# Patient Record
Sex: Female | Born: 1960 | Race: Black or African American | Hispanic: No | Marital: Married | Smoking: Current every day smoker
Health system: Southern US, Community
[De-identification: ages and names within clinical notes are randomized; demographics above are authoritative.]

## PROBLEM LIST (undated history)

## (undated) DIAGNOSIS — I10 Essential (primary) hypertension: Secondary | ICD-10-CM

## (undated) DIAGNOSIS — T7840XA Allergy, unspecified, initial encounter: Secondary | ICD-10-CM

## (undated) DIAGNOSIS — K279 Peptic ulcer, site unspecified, unspecified as acute or chronic, without hemorrhage or perforation: Secondary | ICD-10-CM

## (undated) DIAGNOSIS — D509 Iron deficiency anemia, unspecified: Secondary | ICD-10-CM

## (undated) DIAGNOSIS — Z720 Tobacco use: Secondary | ICD-10-CM

## (undated) HISTORY — DX: Essential (primary) hypertension: I10

## (undated) HISTORY — DX: Allergy, unspecified, initial encounter: T78.40XA

## (undated) HISTORY — DX: Peptic ulcer, site unspecified, unspecified as acute or chronic, without hemorrhage or perforation: K27.9

## (undated) HISTORY — DX: Tobacco use: Z72.0

## (undated) HISTORY — DX: Iron deficiency anemia, unspecified: D50.9

---

## 1999-05-27 ENCOUNTER — Emergency Department (HOSPITAL_COMMUNITY): Admission: EM | Admit: 1999-05-27 | Discharge: 1999-05-27 | Payer: Self-pay | Admitting: Emergency Medicine

## 1999-05-27 ENCOUNTER — Encounter: Payer: Self-pay | Admitting: Emergency Medicine

## 2004-03-01 ENCOUNTER — Ambulatory Visit: Payer: Self-pay | Admitting: Family Medicine

## 2004-03-01 DIAGNOSIS — N959 Unspecified menopausal and perimenopausal disorder: Secondary | ICD-10-CM | POA: Insufficient documentation

## 2004-12-06 ENCOUNTER — Ambulatory Visit: Payer: Self-pay | Admitting: Family Medicine

## 2005-05-31 ENCOUNTER — Encounter (INDEPENDENT_AMBULATORY_CARE_PROVIDER_SITE_OTHER): Payer: Self-pay | Admitting: Family Medicine

## 2005-05-31 LAB — CONVERTED CEMR LAB: Microalbumin U total vol: 1.05 mg/L

## 2005-06-26 ENCOUNTER — Encounter (INDEPENDENT_AMBULATORY_CARE_PROVIDER_SITE_OTHER): Payer: Self-pay | Admitting: *Deleted

## 2005-06-26 ENCOUNTER — Ambulatory Visit: Payer: Self-pay | Admitting: Family Medicine

## 2005-07-13 ENCOUNTER — Emergency Department (HOSPITAL_COMMUNITY): Admission: EM | Admit: 2005-07-13 | Discharge: 2005-07-13 | Payer: Self-pay | Admitting: Emergency Medicine

## 2005-07-18 ENCOUNTER — Ambulatory Visit: Payer: Self-pay | Admitting: Family Medicine

## 2006-06-22 ENCOUNTER — Ambulatory Visit: Payer: Self-pay | Admitting: Family Medicine

## 2006-06-23 ENCOUNTER — Encounter (INDEPENDENT_AMBULATORY_CARE_PROVIDER_SITE_OTHER): Payer: Self-pay | Admitting: Family Medicine

## 2006-06-23 LAB — CONVERTED CEMR LAB: TSH: 0.567 microintl units/mL

## 2006-07-27 ENCOUNTER — Ambulatory Visit: Payer: Self-pay | Admitting: Family Medicine

## 2006-07-27 ENCOUNTER — Encounter (INDEPENDENT_AMBULATORY_CARE_PROVIDER_SITE_OTHER): Payer: Self-pay | Admitting: Specialist

## 2006-07-29 ENCOUNTER — Encounter (INDEPENDENT_AMBULATORY_CARE_PROVIDER_SITE_OTHER): Payer: Self-pay | Admitting: Family Medicine

## 2006-07-29 LAB — CONVERTED CEMR LAB: Pap Smear: NORMAL

## 2006-11-10 ENCOUNTER — Ambulatory Visit: Payer: Self-pay | Admitting: Family Medicine

## 2006-11-19 ENCOUNTER — Encounter (INDEPENDENT_AMBULATORY_CARE_PROVIDER_SITE_OTHER): Payer: Self-pay | Admitting: Family Medicine

## 2006-11-19 DIAGNOSIS — K279 Peptic ulcer, site unspecified, unspecified as acute or chronic, without hemorrhage or perforation: Secondary | ICD-10-CM

## 2006-11-19 DIAGNOSIS — A599 Trichomoniasis, unspecified: Secondary | ICD-10-CM

## 2006-11-19 DIAGNOSIS — I10 Essential (primary) hypertension: Secondary | ICD-10-CM

## 2006-11-19 DIAGNOSIS — R011 Cardiac murmur, unspecified: Secondary | ICD-10-CM | POA: Insufficient documentation

## 2006-11-19 HISTORY — DX: Peptic ulcer, site unspecified, unspecified as acute or chronic, without hemorrhage or perforation: K27.9

## 2006-11-30 DIAGNOSIS — J309 Allergic rhinitis, unspecified: Secondary | ICD-10-CM | POA: Insufficient documentation

## 2006-12-28 ENCOUNTER — Telehealth (INDEPENDENT_AMBULATORY_CARE_PROVIDER_SITE_OTHER): Payer: Self-pay | Admitting: *Deleted

## 2006-12-28 ENCOUNTER — Emergency Department (HOSPITAL_COMMUNITY): Admission: EM | Admit: 2006-12-28 | Discharge: 2006-12-28 | Payer: Self-pay | Admitting: Family Medicine

## 2007-08-09 ENCOUNTER — Ambulatory Visit: Payer: Self-pay | Admitting: Family Medicine

## 2007-08-09 ENCOUNTER — Encounter (INDEPENDENT_AMBULATORY_CARE_PROVIDER_SITE_OTHER): Payer: Self-pay | Admitting: Family Medicine

## 2007-08-11 LAB — CONVERTED CEMR LAB
Albumin: 4 g/dL (ref 3.5–5.2)
Alkaline Phosphatase: 72 units/L (ref 39–117)
Chloride: 101 meq/L (ref 96–112)
Eosinophils Absolute: 0.2 10*3/uL (ref 0.0–0.7)
Glucose, Bld: 78 mg/dL (ref 70–99)
LDL Cholesterol: 89 mg/dL (ref 0–99)
Lymphocytes Relative: 30 % (ref 12–46)
Lymphs Abs: 2.3 10*3/uL (ref 0.7–4.0)
MCV: 88.7 fL (ref 78.0–100.0)
Neutro Abs: 4.8 10*3/uL (ref 1.7–7.7)
Neutrophils Relative %: 61 % (ref 43–77)
Platelets: 415 10*3/uL — ABNORMAL HIGH (ref 150–400)
Potassium: 3.3 meq/L — ABNORMAL LOW (ref 3.5–5.3)
Sodium: 140 meq/L (ref 135–145)
TSH: 1.026 microintl units/mL (ref 0.350–5.50)
Total Protein: 6.9 g/dL (ref 6.0–8.3)
Triglycerides: 100 mg/dL (ref <150)
WBC: 7.8 10*3/uL (ref 4.0–10.5)

## 2007-08-17 ENCOUNTER — Encounter (INDEPENDENT_AMBULATORY_CARE_PROVIDER_SITE_OTHER): Payer: Self-pay | Admitting: Family Medicine

## 2007-12-31 ENCOUNTER — Ambulatory Visit: Payer: Self-pay | Admitting: Internal Medicine

## 2007-12-31 DIAGNOSIS — H1030 Unspecified acute conjunctivitis, unspecified eye: Secondary | ICD-10-CM | POA: Insufficient documentation

## 2008-02-02 ENCOUNTER — Telehealth (INDEPENDENT_AMBULATORY_CARE_PROVIDER_SITE_OTHER): Payer: Self-pay | Admitting: *Deleted

## 2008-02-15 ENCOUNTER — Encounter (INDEPENDENT_AMBULATORY_CARE_PROVIDER_SITE_OTHER): Payer: Self-pay | Admitting: *Deleted

## 2008-05-29 ENCOUNTER — Ambulatory Visit: Payer: Self-pay | Admitting: Family Medicine

## 2008-05-29 DIAGNOSIS — N950 Postmenopausal bleeding: Secondary | ICD-10-CM

## 2008-05-29 LAB — CONVERTED CEMR LAB: LH: 47.8 milliintl units/mL

## 2008-06-02 ENCOUNTER — Ambulatory Visit (HOSPITAL_COMMUNITY): Admission: RE | Admit: 2008-06-02 | Discharge: 2008-06-02 | Payer: Self-pay | Admitting: Family Medicine

## 2008-06-05 ENCOUNTER — Telehealth (INDEPENDENT_AMBULATORY_CARE_PROVIDER_SITE_OTHER): Payer: Self-pay | Admitting: Family Medicine

## 2008-07-28 ENCOUNTER — Ambulatory Visit: Payer: Self-pay | Admitting: Nurse Practitioner

## 2008-07-28 DIAGNOSIS — R634 Abnormal weight loss: Secondary | ICD-10-CM | POA: Insufficient documentation

## 2008-07-28 DIAGNOSIS — F17200 Nicotine dependence, unspecified, uncomplicated: Secondary | ICD-10-CM | POA: Insufficient documentation

## 2008-08-08 ENCOUNTER — Ambulatory Visit: Payer: Self-pay | Admitting: Family Medicine

## 2008-08-08 ENCOUNTER — Encounter (INDEPENDENT_AMBULATORY_CARE_PROVIDER_SITE_OTHER): Payer: Self-pay | Admitting: Family Medicine

## 2008-08-08 DIAGNOSIS — B373 Candidiasis of vulva and vagina: Secondary | ICD-10-CM

## 2008-08-08 LAB — CONVERTED CEMR LAB
Glucose, Urine, Semiquant: NEGATIVE
pH: 7

## 2008-08-09 ENCOUNTER — Ambulatory Visit: Payer: Self-pay | Admitting: Family Medicine

## 2008-08-09 LAB — CONVERTED CEMR LAB
ALT: 13 units/L (ref 0–35)
AST: 20 units/L (ref 0–37)
Basophils Relative: 0 % (ref 0–1)
CO2: 36 meq/L — ABNORMAL HIGH (ref 19–32)
Cholesterol: 130 mg/dL (ref 0–200)
Creatinine, Ser: 0.84 mg/dL (ref 0.40–1.20)
Eosinophils Absolute: 0.1 10*3/uL (ref 0.0–0.7)
MCHC: 31.8 g/dL (ref 30.0–36.0)
MCV: 88.2 fL (ref 78.0–100.0)
Neutrophils Relative %: 67 % (ref 43–77)
Platelets: 375 10*3/uL (ref 150–400)
Total Bilirubin: 0.3 mg/dL (ref 0.3–1.2)
Total CHOL/HDL Ratio: 3.3
VLDL: 19 mg/dL (ref 0–40)

## 2008-08-10 ENCOUNTER — Telehealth (INDEPENDENT_AMBULATORY_CARE_PROVIDER_SITE_OTHER): Payer: Self-pay | Admitting: Family Medicine

## 2008-08-11 ENCOUNTER — Telehealth (INDEPENDENT_AMBULATORY_CARE_PROVIDER_SITE_OTHER): Payer: Self-pay | Admitting: Family Medicine

## 2008-08-14 ENCOUNTER — Ambulatory Visit: Payer: Self-pay | Admitting: *Deleted

## 2008-08-24 ENCOUNTER — Ambulatory Visit: Payer: Self-pay | Admitting: Family Medicine

## 2008-08-28 ENCOUNTER — Encounter (INDEPENDENT_AMBULATORY_CARE_PROVIDER_SITE_OTHER): Payer: Self-pay | Admitting: Family Medicine

## 2009-01-10 ENCOUNTER — Ambulatory Visit: Payer: Self-pay | Admitting: Physician Assistant

## 2009-01-10 DIAGNOSIS — E876 Hypokalemia: Secondary | ICD-10-CM

## 2009-01-10 LAB — CONVERTED CEMR LAB
CO2: 24 meq/L (ref 19–32)
Calcium: 8.4 mg/dL (ref 8.4–10.5)
Chloride: 107 meq/L (ref 96–112)
Glucose, Bld: 89 mg/dL (ref 70–99)
Sodium: 140 meq/L (ref 135–145)

## 2009-01-11 ENCOUNTER — Encounter: Payer: Self-pay | Admitting: Physician Assistant

## 2009-01-17 ENCOUNTER — Ambulatory Visit: Payer: Self-pay | Admitting: Physician Assistant

## 2009-01-17 DIAGNOSIS — R002 Palpitations: Secondary | ICD-10-CM | POA: Insufficient documentation

## 2009-01-18 ENCOUNTER — Encounter: Payer: Self-pay | Admitting: Physician Assistant

## 2009-01-18 ENCOUNTER — Ambulatory Visit (HOSPITAL_COMMUNITY): Admission: RE | Admit: 2009-01-18 | Discharge: 2009-01-18 | Payer: Self-pay | Admitting: Internal Medicine

## 2009-01-18 ENCOUNTER — Encounter (INDEPENDENT_AMBULATORY_CARE_PROVIDER_SITE_OTHER): Payer: Self-pay | Admitting: Internal Medicine

## 2009-01-22 ENCOUNTER — Encounter: Payer: Self-pay | Admitting: Physician Assistant

## 2009-01-23 ENCOUNTER — Encounter: Payer: Self-pay | Admitting: Physician Assistant

## 2009-01-25 ENCOUNTER — Encounter (INDEPENDENT_AMBULATORY_CARE_PROVIDER_SITE_OTHER): Payer: Self-pay | Admitting: *Deleted

## 2009-02-01 ENCOUNTER — Encounter: Payer: Self-pay | Admitting: Physician Assistant

## 2009-02-07 ENCOUNTER — Telehealth (INDEPENDENT_AMBULATORY_CARE_PROVIDER_SITE_OTHER): Payer: Self-pay | Admitting: Nurse Practitioner

## 2009-02-12 LAB — CONVERTED CEMR LAB
Ferritin: 11 ng/mL (ref 10–291)
Folate: 10.4 ng/mL

## 2009-02-13 ENCOUNTER — Telehealth: Payer: Self-pay | Admitting: Physician Assistant

## 2009-02-19 ENCOUNTER — Telehealth: Payer: Self-pay | Admitting: Physician Assistant

## 2009-02-27 DIAGNOSIS — D509 Iron deficiency anemia, unspecified: Secondary | ICD-10-CM

## 2009-02-27 HISTORY — DX: Iron deficiency anemia, unspecified: D50.9

## 2009-03-01 ENCOUNTER — Encounter (INDEPENDENT_AMBULATORY_CARE_PROVIDER_SITE_OTHER): Payer: Self-pay | Admitting: *Deleted

## 2009-03-28 ENCOUNTER — Emergency Department (HOSPITAL_COMMUNITY): Admission: EM | Admit: 2009-03-28 | Discharge: 2009-03-28 | Payer: Self-pay | Admitting: Family Medicine

## 2009-03-29 ENCOUNTER — Ambulatory Visit: Payer: Self-pay | Admitting: Physician Assistant

## 2009-03-29 LAB — CONVERTED CEMR LAB
BUN: 13 mg/dL (ref 6–23)
Basophils Absolute: 0 10*3/uL (ref 0.0–0.1)
Chloride: 107 meq/L (ref 96–112)
Eosinophils Relative: 2 % (ref 0–5)
HCT: 34.9 % — ABNORMAL LOW (ref 36.0–46.0)
Hemoglobin: 11 g/dL — ABNORMAL LOW (ref 12.0–15.0)
Lymphocytes Relative: 31 % (ref 12–46)
Lymphs Abs: 1.6 10*3/uL (ref 0.7–4.0)
Monocytes Absolute: 0.5 10*3/uL (ref 0.1–1.0)
Neutro Abs: 3 10*3/uL (ref 1.7–7.7)
Potassium: 3.4 meq/L — ABNORMAL LOW (ref 3.5–5.3)
Sodium: 143 meq/L (ref 135–145)
WBC: 5.2 10*3/uL (ref 4.0–10.5)

## 2009-04-03 ENCOUNTER — Ambulatory Visit: Payer: Self-pay | Admitting: Physician Assistant

## 2009-04-05 LAB — CONVERTED CEMR LAB
Hgb A2 Quant: 2.5 % (ref 2.2–3.2)
Hgb A: 97.5 % (ref 96.8–97.8)
Hgb S Quant: 0 % (ref 0.0–0.0)

## 2009-04-19 ENCOUNTER — Telehealth: Payer: Self-pay | Admitting: Physician Assistant

## 2009-04-27 ENCOUNTER — Ambulatory Visit: Payer: Self-pay | Admitting: Physician Assistant

## 2009-04-27 DIAGNOSIS — N760 Acute vaginitis: Secondary | ICD-10-CM | POA: Insufficient documentation

## 2009-04-27 DIAGNOSIS — S335XXA Sprain of ligaments of lumbar spine, initial encounter: Secondary | ICD-10-CM

## 2009-04-27 DIAGNOSIS — R82998 Other abnormal findings in urine: Secondary | ICD-10-CM

## 2009-04-27 LAB — CONVERTED CEMR LAB
Bilirubin Urine: NEGATIVE
Glucose, Urine, Semiquant: NEGATIVE
KOH Prep: NEGATIVE
Protein, U semiquant: 100
Specific Gravity, Urine: 1.015
Whiff Test: POSITIVE
pH: 7

## 2009-04-28 ENCOUNTER — Encounter: Payer: Self-pay | Admitting: Physician Assistant

## 2009-06-15 ENCOUNTER — Telehealth: Payer: Self-pay | Admitting: Physician Assistant

## 2009-06-22 ENCOUNTER — Encounter: Payer: Self-pay | Admitting: Physician Assistant

## 2010-01-16 ENCOUNTER — Ambulatory Visit: Payer: Self-pay | Admitting: Nurse Practitioner

## 2010-01-16 ENCOUNTER — Encounter (INDEPENDENT_AMBULATORY_CARE_PROVIDER_SITE_OTHER): Payer: Self-pay | Admitting: Internal Medicine

## 2010-01-16 LAB — CONVERTED CEMR LAB
Calcium: 9.1 mg/dL (ref 8.4–10.5)
Creatinine, Ser: 0.75 mg/dL (ref 0.40–1.20)
Sodium: 140 meq/L (ref 135–145)

## 2010-01-21 ENCOUNTER — Encounter (INDEPENDENT_AMBULATORY_CARE_PROVIDER_SITE_OTHER): Payer: Self-pay | Admitting: Internal Medicine

## 2010-02-13 ENCOUNTER — Encounter: Payer: Self-pay | Admitting: Physician Assistant

## 2010-02-13 ENCOUNTER — Ambulatory Visit: Payer: Self-pay | Admitting: Nurse Practitioner

## 2010-02-13 DIAGNOSIS — K5900 Constipation, unspecified: Secondary | ICD-10-CM | POA: Insufficient documentation

## 2010-02-19 ENCOUNTER — Telehealth (INDEPENDENT_AMBULATORY_CARE_PROVIDER_SITE_OTHER): Payer: Self-pay | Admitting: Nurse Practitioner

## 2010-04-20 ENCOUNTER — Encounter: Payer: Self-pay | Admitting: Internal Medicine

## 2010-04-21 ENCOUNTER — Encounter: Payer: Self-pay | Admitting: Family Medicine

## 2010-04-21 ENCOUNTER — Encounter: Payer: Self-pay | Admitting: Occupational Therapy

## 2010-04-21 ENCOUNTER — Encounter: Payer: Self-pay | Admitting: Internal Medicine

## 2010-04-22 ENCOUNTER — Encounter: Payer: Self-pay | Admitting: Family Medicine

## 2010-04-28 LAB — CONVERTED CEMR LAB
BUN: 13 mg/dL (ref 6–23)
Basophils Absolute: 0 10*3/uL (ref 0.0–0.1)
Basophils Absolute: 0 10*3/uL (ref 0.0–0.1)
Basophils Relative: 0 % (ref 0–1)
Basophils Relative: 0 % (ref 0–1)
Blood in Urine, dipstick: NEGATIVE
Calcium: 8.4 mg/dL (ref 8.4–10.5)
Chlamydia, DNA Probe: NEGATIVE
Creatinine, Ser: 0.82 mg/dL (ref 0.40–1.20)
Eosinophils Absolute: 0.1 10*3/uL (ref 0.0–0.7)
Eosinophils Relative: 2 % (ref 0–5)
GC Probe Amp, Genital: NEGATIVE
Glucose, Bld: 82 mg/dL (ref 70–99)
Glucose, Urine, Semiquant: NEGATIVE
HCT: 33.7 % — ABNORMAL LOW (ref 36.0–46.0)
Hemoglobin: 10.6 g/dL — ABNORMAL LOW (ref 12.0–15.0)
KOH Prep: NEGATIVE
Lymphocytes Relative: 28 % (ref 12–46)
Lymphocytes Relative: 32 % (ref 12–46)
MCHC: 30.9 g/dL (ref 30.0–36.0)
MCHC: 32.2 g/dL (ref 30.0–36.0)
MCV: 90.1 fL (ref 78.0–100.0)
Magnesium: 2.1 mg/dL (ref 1.5–2.5)
Monocytes Absolute: 0.3 10*3/uL (ref 0.1–1.0)
Neutro Abs: 3.7 10*3/uL (ref 1.7–7.7)
Nitrite: NEGATIVE
OCCULT 1: NEGATIVE
Platelets: 304 10*3/uL (ref 150–400)
Platelets: 345 10*3/uL (ref 150–400)
RDW: 15.6 % — ABNORMAL HIGH (ref 11.5–15.5)
RDW: 16.3 % — ABNORMAL HIGH (ref 11.5–15.5)
Rapid HIV Screen: NEGATIVE
TSH: 1.35 microintl units/mL (ref 0.350–4.500)
TSH: 1.408 microintl units/mL (ref 0.350–4.500)
pH: 6.5

## 2010-05-02 NOTE — Assessment & Plan Note (Signed)
Summary: FOLLOW UP WITH Oyindamola Key PA //GK   Vital Signs:  Patient profile:   50 year old female Menstrual status:  irregular Height:      63.5 inches Weight:      145 pounds BMI:     25.37 Temp:     97.4 degrees F oral Pulse rate:   62 / minute Pulse rhythm:   regular Resp:     18 per minute BP sitting:   139 / 88  (right arm) Cuff size:   regular  Vitals Entered By: Armenia Shannon (April 03, 2009 11:53 AM) CC: pt would like a refill on potassium med since she has not got the maxzide yet...., Hypertension Management Is Patient Diabetic? No Pain Assessment Patient in pain? no       Does patient need assistance? Functional Status Self care Ambulation Normal   CC:  pt would like a refill on potassium med since she has not got the maxzide yet.... and Hypertension Management.  History of Present Illness: Here for f/u. Her palp's have resolved.  I tried to switch her to to [facility] but she had so much HCTZ left that she stayed on it.  She ran out of potassium.  She has not had any more palp's.  She did have labs done last week.  The potassium was low. Her hgb has come up to 11.  She is taking the iron.  Recent labs confirm that she has iron def. anemia.  She does not have a period any longer.  She states she has a h/o anemia and has been iron for a long time.  She says she has a h/o colitis.  Do not see this in the EMR.  She describes a barium enema many years ago and what sounds like diverticulosis.  She has occ. diarrhea if she eats the wrong thing.  Otherwise, she has no problems.  She denies melena, hematochezia, hematemesis, hematuria, hemoptysis.  She denies any symptoms of GERD.  No weight loss.  No fevers.  She still has hot flashes and mood swings from post-menopausal syndrome.  She has not had a colonsocopy.  She never did the stool cards.   Hypertension History:      She denies chest pain, palpitations, dyspnea with exertion, and syncope.        Positive major  cardiovascular risk factors include hypertension and current tobacco user.  Negative major cardiovascular risk factors include female age less than 29 years old.    Current Medications (verified): 1)  Ocuflox 0.3 % Soln (Ofloxacin) .Marland Kitchen.. 1 Drop Left Eye 4 Times Daily For 7 Days 2)  Monistat 7  Kit (Miconazole Nitrate) .... Use Per Box Directions 3)  Diflucan 150 Mg Tabs (Fluconazole) .... Take 1 Tablet By Mouth Once A Day X 1 4)  Ferrous Sulfate 325 (65 Fe) Mg Tabs (Ferrous Sulfate) .... One Tablet By Mouth Two Times A Day 5)  Maxzide-25 37.5-25 Mg Tabs (Triamterene-Hctz) .... Take 1 Tablet By Mouth Once A Day  Allergies (verified): No Known Drug Allergies  Past History:  Past Medical History: Current Problems:  PALPITATIONS (ICD-785.1) HYPOKALEMIA (ICD-276.8) TOBACCO ABUSE (ICD-305.1) WEIGHT LOSS (ICD-783.21) POSTMENOPAUSAL SYNDROME (ICD-627.9) POSTMENOPAUSAL BLEEDING (ICD-627.1) ALLERGIC RHINITIS (ICD-477.9) h/o TRICHOMONIASIS NOS (ICD-131.9) SYSTOLIC MURMUR (UEA-540.9)   a.  Echo 12/2008:  Normal; Good LVF; No valvular abnormalities PEPTIC ULCER DISEASE (ICD-533.90) HYPERTENSION (ICD-401.9) Anemia-iron deficiency    a.  originally placed on Iron in 2005    b.  has reported 1 menstrual  cycle per year since about 2007 according to paper chart  Review of Systems  The patient denies fever and unusual weight change.    Physical Exam  General:  alert, well-developed, and well-nourished.   Head:  normocephalic and atraumatic.   Neck:  supple.   Lungs:  normal breath sounds, no crackles, and no wheezes.   Heart:  normal rate and regular rhythm.   Extremities:  no edema  Neurologic:  alert & oriented X3 and cranial nerves II-XII intact.   Psych:  normally interactive.     Impression & Recommendations:  Problem # 1:  ANEMIA-IRON DEFICIENCY (ICD-280.9)  ? etiology continue iron she has apparently had a long h/o IDA check stool cards check Hgb electrophoresis consider  colo +/- EGD with Dr. Corinda Gubler in the spring unless need to do sooner     Her updated medication list for this problem includes:    Ferrous Sulfate 325 (65 Fe) Mg Tabs (Ferrous sulfate) ..... One tablet by mouth two times a day  Orders: Hemoccult Cards -3 specimans (take home) (16109)  Problem # 2:  PALPITATIONS (ICD-785.1) resolved  Problem # 3:  HYPOKALEMIA (ICD-276.8) refill K+ as long as she continues to use HCTZ repeat BMET in 2 weeks  Problem # 4:  HYPERTENSION (ICD-401.9) fair control  The following medications were removed from the medication list:    Maxzide-25 37.5-25 Mg Tabs (Triamterene-hctz) .Marland Kitchen... Take 1 tablet by mouth once a day Her updated medication list for this problem includes:    Hydrochlorothiazide 25 Mg Tabs (Hydrochlorothiazide) .Marland Kitchen... Take 1 tablet by mouth once a day  Problem # 5:  SCREENING FOR MLIG NEOP, BREAST, NOS (ICD-V76.10)  past due for mammo  Orders: Mammogram (Screening) (Mammo)  Complete Medication List: 1)  Ocuflox 0.3 % Soln (Ofloxacin) .Marland Kitchen.. 1 drop left eye 4 times daily for 7 days 2)  Monistat 7 Kit (Miconazole nitrate) .... Use per box directions 3)  Diflucan 150 Mg Tabs (Fluconazole) .... Take 1 tablet by mouth once a day x 1 4)  Ferrous Sulfate 325 (65 Fe) Mg Tabs (Ferrous sulfate) .... One tablet by mouth two times a day 5)  Hydrochlorothiazide 25 Mg Tabs (Hydrochlorothiazide) .... Take 1 tablet by mouth once a day 6)  Klor-con M20 20 Meq Cr-tabs (Potassium chloride crys cr) .... Take 1 tablet by mouth once a day  Hypertension Assessment/Plan:      The patient's hypertensive risk group is category B: At least one risk factor (excluding diabetes) with no target organ damage.  Her calculated 10 year risk of coronary heart disease is 6 %.  Today's blood pressure is 139/88.  Her blood pressure goal is < 140/90.   Patient Instructions: 1)  Complete stool cards and return to our office. 2)  Stop potassium when you change HCTZ to  maxzide. 3)  Return to the office in 2 weeks for labs (BMET; Dx 401.1) 4)  Please schedule a follow-up appointment in 4 months with Arletha Marschke for CPP. 5)    Prescriptions: KLOR-CON M20 20 MEQ CR-TABS (POTASSIUM CHLORIDE CRYS CR) Take 1 tablet by mouth once a day  #30 x 2   Entered and Authorized by:   Tereso Newcomer PA-C   Signed by:   Tereso Newcomer PA-C on 04/03/2009   Method used:   Print then Give to Patient   RxID:   [id number]

## 2010-05-02 NOTE — Letter (Signed)
Summary: *HSN Results Follow up  [facility]  [address] [place], [place] [address]   Phone: [phone]  Fax: [phone]      01/21/2010   [name] [facility] [address] [place], [place]  [address]   Dear  Ms. Nicole Barrera,                            ____S.Drinkard,FNP   ____D. Gore,FNP       ____B. McPherson,MD   ____V. Rankins,MD    __X__E. Mulberry,MD    ____N. Daphine Deutscher, FNP  ____D. Reche Dixon, MD    ____K. Philipp Deputy, MD    ____Other     This letter is to inform you that your recent test(s):  _______Pap Smear    ____X___Lab Test     _______X-ray    ___X____ is within acceptable limits  _______ requires a medication change  _______ requires a follow-up lab visit  _______ requires a follow-up visit with your provider   Comments:  potassium and kidney function were fine.       _________________________________________________________ If you have any questions, please contact our office                     Sincerely,  Julieanne Manson MD [facility]

## 2010-05-02 NOTE — Progress Notes (Signed)
  Phone Note Call from Patient Call back at [facility] Phone [phone] Call back at [phone]   Summary of Call: Pt misplaced her potassium  medication (Klor con m20) and she would like to know if the provider can call in to [facility]. Alben Spittle PA  Initial call taken by: Manon Hilding,  April 19, 2009 9:41 AM  Follow-up for Phone Call        called med into pharmacy...Marland KitchenMarland KitchenArmenia Shannon  April 20, 2009 3:51 PM

## 2010-05-02 NOTE — Assessment & Plan Note (Signed)
Summary: Complete Physical Exam   Vital Signs:  Patient profile:   50 year old female Menstrual status:  postmenopausal Height:      63.5 inches Weight:      145.06 pounds BMI:     25.38 Temp:     97.2 degrees F oral BP sitting:   140 / 80  (left arm) Cuff size:   regular  Vitals Entered By: Hale Drone CMA (February 13, 2010 2:18 PM)  Nutrition Counseling: Patient's BMI is greater than 25 and therefore counseled on weight management options. CC: Here for her 50 y/o CP Is Patient Diabetic? No Pain Assessment Patient in pain? no       Does patient need assistance? Functional Status Self care Ambulation Normal LMP - Character: heavy     Menstrual Status postmenopausal Last PAP Result NEGATIVE FOR INTRAEPITHELIAL LESIONS OR MALIGNANCY.   CC:  Here for her 50 y/o CP.  History of Present Illness:  Pt into the office for a complete physical exam  PAP - last done 1 year ago. Pt has been going through perimenopause since age 66. Still with hot flashes, night sweats and mood swings. Last menses last in 2009 and even prior to then she was only have about once per year No hx of GYN surgery 4 adult children, married  Mammogram - Pt has never had a mammogram No family history of breast cancer no self breast exams at home  Optho - Pt wears glasses - Dr. Marti Sleigh.  Last eye exam was in 2009  Dental - no recent dental exam  Tdap - last done within the past 5 years.  Habits & Providers  Alcohol-Tobacco-Diet     Alcohol drinks/day: 0     Tobacco Status: current     Tobacco Counseling: to quit use of tobacco products     Cigarette Packs/Day: 0.5  Exercise-Depression-Behavior     Does Patient Exercise: no     Drug Use: no  Current Medications (verified): 1)  Ocuflox 0.3 % Soln (Ofloxacin) .Marland Kitchen.. 1 Drop Left Eye 4 Times Daily For 7 Days 2)  Monistat 7  Kit (Miconazole Nitrate) .... Use Per Box Directions 3)  Diflucan 150 Mg Tabs (Fluconazole) .... Take 1 Tablet By  Mouth Once A Day X 1 4)  Nu-Iron 150 Mg Caps (Polysaccharide Iron Complex) .... Take 1 Tablet By Mouth Two Times A Day (Pharmacy D/c Feso4) 5)  Hydrochlorothiazide 25 Mg Tabs (Hydrochlorothiazide) .... Take 1 Tablet By Mouth Once A Day 6)  Klor-Con M20 20 Meq Cr-Tabs (Potassium Chloride Crys Cr) .... Take 1 Tablet By Mouth Once A Day 7)  Naprosyn 500 Mg Tabs (Naproxen) .... Take 1 Tablet By Mouth With Food  Two Times A Day As Needed For Pain  Allergies (verified): No Known Drug Allergies  Social History: Does Patient Exercise:  no  Review of Systems General:  Complains of sweats; denies fever. Eyes:  Denies blurring. ENT:  Denies earache. CV:  Denies chest pain or discomfort. Resp:  Denies cough. GI:  Complains of constipation; denies abdominal pain. GU:  Denies dysuria. MS:  Denies joint pain. Derm:  Denies dryness. Neuro:  Denies headaches. Psych:  Denies anxiety and depression.  Physical Exam  General:  alert.   Head:  normocephalic.   Eyes:  pupils equal and pupils round.   Ears:  ear piercing(s) noted.  bil TM with bony landmarks present Nose:  no nasal discharge.   Mouth:  missing bottom incisors pharynx pink and moist and  poor dentition.   Neck:  supple.   Chest Wall:  no mass.   Breasts:  no masses and no abnormal thickening.   Lungs:  normal breath sounds.   Heart:  normal rate and regular rhythm.   Abdomen:  soft, non-tender, and normal bowel sounds.   Rectal:  no external abnormalities.   Msk:  normal ROM.   Neurologic:  alert & oriented X3.   Skin:  color normal.   Psych:  Oriented X3.    Pelvic Exam  Vulva:      normal appearance.   Urethra and Bladder:      Urethra--no discharge.  Bladder--normal.   Vagina:      malodorus, copious discharge.   Cervix:      midposition.   Uterus:      smooth.   Adnexa:      nontender bilaterally.   Rectum:      normal, heme negative stool.      Impression & Recommendations:  Problem # 1:  ROUTINE  GYNECOLOGICAL EXAMINATION (ICD-V72.31) PAP done tdap up to date PHQ-9 score = 0 rec optho and dental exam Orders: Hemoccult Cards MCR Screening (G0107) KOH/ WET Mount [phone]) Pap Smear, Thin Prep ( Collection of) [phone]) T- GC Chlamydia (09811) UA Dipstick w/o Micro (manual) (81002) Rapid HIV  (91478) T-TSH (29562-13086)  Problem # 2:  UNSPECIFIED BREAST SCREENING (ICD-V76.10) self breast exam placcard given to pt mammogram ordered Orders: Mammogram (Screening) (Mammo)  Problem # 3:  HYPERTENSION (ICD-401.9) BP elevated today most likely due to smoking prior to coming into the office Advised pt on things that raise her blood pressure Her updated medication list for this problem includes:    Hydrochlorothiazide 25 Mg Tabs (Hydrochlorothiazide) .Marland Kitchen... Take 1 tablet by mouth once a day  Orders: EKG w/ Interpretation (93000) T-Urine Microalbumin w/creat. ratio [phone]) T-CBC w/Diff (32440-10272)  Problem # 4:  TOBACCO ABUSE (ICD-305.1) advised pt to quit smoking  Complete Medication List: 1)  Ocuflox 0.3 % Soln (Ofloxacin) .Marland Kitchen.. 1 drop left eye 4 times daily for 7 days 2)  Nu-iron 150 Mg Caps (Polysaccharide iron complex) .... Take 1 tablet by mouth two times a day (pharmacy d/c feso4) 3)  Hydrochlorothiazide 25 Mg Tabs (Hydrochlorothiazide) .... Take 1 tablet by mouth once a day 4)  Klor-con M20 20 Meq Cr-tabs (Potassium chloride crys cr) .... Hold 5)  Naprosyn 500 Mg Tabs (Naproxen) .... Take 1 tablet by mouth with food  two times a day as needed for pain  Patient Instructions: 1)  You have declined the flu vaccine today.  Remember this is indicated for all adults and especially if you will be around young children.  If you change your mind then return for an office visit. 2)  Blood pressure - slightly elevated today.  May be due to cigarette smoking prior to coming into the office 3)  Keep your appointment for mammogram 4)  Follow up in 2 weeks for Triage visit:  fasting lab - lipids and blood pressure check 5)  no food after midnight before this visit - may take blood pressure medications with WATER only   Orders Added: 1)  Est. Patient age 35-64 [3] 2)  Hemoccult Cards MCR Screening [G0107] 3)  KOH/ WET Mount [87210] 4)  Pap Smear, Thin Prep ( Collection of) [Q0091] 5)  T- GC Chlamydia [53664] 6)  UA Dipstick w/o Micro (manual) [81002] 7)  EKG w/ Interpretation [93000] 8)  T-Urine Microalbumin w/creat. ratio [[id number]] 9)  T-CBC  w/Diff [16109-60454] 10)  Rapid HIV  [92370] 11)  T-TSH [09811-91478] 12)  Mammogram (Screening) [Mammo]    Laboratory Results   Urine Tests    Routine Urinalysis   Glucose: negative   (Normal Range: Negative) Bilirubin: small   (Normal Range: Negative) Ketone: trace (5)   (Normal Range: Negative) Spec. Gravity: 1.025   (Normal Range: 1.003-1.035) Blood: negative   (Normal Range: Negative) pH: 6.5   (Normal Range: 5.0-8.0) Protein: 100   (Normal Range: Negative) Urobilinogen: 0.2   (Normal Range: 0-1) Nitrite: negative   (Normal Range: Negative) Leukocyte Esterace: trace   (Normal Range: Negative)    Date/Time Received: February 13, 2010 3:26 PM   Wet Mount Source: vaginal WBC/hpf: 1-5 Bacteria/hpf: rare Clue cells/hpf: none Yeast/hpf: none Wet Mount KOH: Negative Trichomonas/hpf: none  Other Tests  Rapid HIV: negative  Stool - Occult Blood Hemmoccult #1: negative Date: 02/14/2010   Prevention & Chronic Care Immunizations   Influenza vaccine: Not documented   Influenza vaccine deferral: Refused  (02/13/2010)    Tetanus booster: 04/01/2003: historical per pt    Pneumococcal vaccine: Not documented  Other Screening   Pap smear: NEGATIVE FOR INTRAEPITHELIAL LESIONS OR MALIGNANCY.  (08/08/2008)    Mammogram: Normal  (07/27/2006)   Smoking status: current  (02/13/2010)   Smoking cessation counseling: YES  (01/17/2009)  Lipids   Total Cholesterol: 130   (08/08/2008)   LDL: 71  (08/08/2008)   LDL Direct: Not documented   HDL: 40  (08/08/2008)   Triglycerides: 93  (08/08/2008)  Hypertension   Last Blood Pressure: 140 / 80  (02/13/2010)   Serum creatinine: 0.75  (01/16/2010)   Serum potassium 4.1  (01/16/2010)  Self-Management Support :    Hypertension self-management support: Not documented    EKG  Procedure date:  02/13/2010  Findings:      NSR

## 2010-05-02 NOTE — Assessment & Plan Note (Signed)
Summary: BLOOD PRESSURE//MC  Nurse Visit   Vital Signs:  Patient profile:   50 year old female Menstrual status:  irregular Weight:      144.8 pounds Pulse rate:   68 / minute Pulse rhythm:   regular Resp:     20 per minute BP sitting:   126 / 78  (left arm) Cuff size:   regular  Vitals Entered By: Dutch Quint RN (January 16, 2010 10:32 AM)  Patient Instructions: 1)  Reviewed with Wende Mott 2)  Your blood pressure is good today 3)  We will refill your HCTZ today, but you need to make an appointment to see your Shalayah Beagley. 4)  We will call you with results of your labwork. 5)  Call if anything changes or if you have any questions.   CC:  BP check.  History of Present Illness: Here for BP check -- needs refills on HCTZ and Klor-Con.  Has run out of Klor-con, but takes HCTZ every day.   Review of Systems CV:  Denies bluish discoloration of lips or nails, chest pain or discomfort, difficulty breathing at night, difficulty breathing while lying down, fainting, fatigue, leg cramps with exertion, lightheadness, near fainting, palpitations, shortness of breath with exertion, swelling of feet, swelling of hands, and weight gain.   Physical Exam  Lungs:  normal respiratory effort, normal breath sounds, no crackles, and no wheezes.   Heart:  normal rate and regular rhythm.    CC: BP check Is Patient Diabetic? No Pain Assessment Patient in pain? no       Does patient need assistance? Functional Status Self care Ambulation Normal   Allergies: No Known Drug Allergies  Impression & Recommendations:  Problem # 1:  HYPERTENSION (ICD-401.9)  no f/u in months BP controlled out of K+ for 2 mos get bmet today restart KlorCon if needed schedule appt for f/u Tereso Newcomer PA-C  January 16, 2010 10:52 AM    Her updated medication list for this problem includes:    Hydrochlorothiazide 25 Mg Tabs (Hydrochlorothiazide) .Marland Kitchen... Take 1 tablet by mouth once a  day  Orders: T-Basic Metabolic Panel [phone])  Complete Medication List: 1)  Ocuflox 0.3 % Soln (Ofloxacin) .Marland Kitchen.. 1 drop left eye 4 times daily for 7 days 2)  Monistat 7 Kit (Miconazole nitrate) .... Use per box directions 3)  Diflucan 150 Mg Tabs (Fluconazole) .... Take 1 tablet by mouth once a day x 1 4)  Nu-iron 150 Mg Caps (Polysaccharide iron complex) .... Take 1 tablet by mouth two times a day (pharmacy d/c feso4) 5)  Hydrochlorothiazide 25 Mg Tabs (Hydrochlorothiazide) .... Take 1 tablet by mouth once a day 6)  Klor-con M20 20 Meq Cr-tabs (Potassium chloride crys cr) .... Take 1 tablet by mouth once a day 7)  Naprosyn 500 Mg Tabs (Naproxen) .... Take 1 tablet by mouth with food  two times a day as needed for pain   Orders Added: 1)  Est. Patient Level I [84166] 2)  T-Basic Metabolic Panel [[id number]]

## 2010-05-02 NOTE — Letter (Signed)
Summary: TRANSTHORACIC,ECHOCARDIOGRAPHY  TRANSTHORACIC,ECHOCARDIOGRAPHY   Imported By: Arta Bruce 02/28/2009 12:53:18  _____________________________________________________________________  External Attachment:    Type:   Image     Comment:   External Document

## 2010-05-02 NOTE — Progress Notes (Signed)
  Phone Note Outgoing Call   Summary of Call: Has patient done stool cards yet? Initial call taken by: Tereso Newcomer PA-C,  June 15, 2009 1:16 PM  Follow-up for Phone Call        number is disconnected...Marland KitchenMarland Kitchen pt comes in today Follow-up by: Armenia Shannon,  June 19, 2009 10:31 AM

## 2010-05-02 NOTE — Progress Notes (Signed)
Summary: PAP results  Phone Note Outgoing Call   Summary of Call: notify pt that her PAP has some abnormal cells.  she is going through the "change" so at times this may cause a change in the vagina due to decrease in hormone. advise pt to repeat pap in 6 months Initial call taken by: Lehman Prom FNP,  February 19, 2010 6:21 PM  Follow-up for Phone Call        pt informed of above information. Follow-up by: Levon Hedger,  February 20, 2010 4:30 PM

## 2010-05-02 NOTE — Assessment & Plan Note (Signed)
Summary: BACK PAIN///KT   Vital Signs:  Patient profile:   50 year old female Menstrual status:  irregular Height:      63.5 inches Weight:      146 pounds BMI:     25.55 Temp:     97.1 degrees F oral Pulse rate:   72 / minute Pulse rhythm:   regular Resp:     18 per minute BP sitting:   120 / 80  (left arm) Cuff size:   regular  Vitals Entered By: [place] Shannon (April 27, 2009 12:07 PM) CC: pt says her back hurt on the left.... pt admitts to drinking alot of sodas... Is Patient Diabetic? No Pain Assessment Patient in pain? no       Does patient need assistance? Functional Status Self care Ambulation Normal   CC:  pt says her back hurt on the left.... pt admitts to drinking alot of sodas....  History of Present Illness: Here for low back pain on the left x 2-3 days.  Not getting any worse.  No injury.  No radiating pain.  No assoc. fevers, chills.  No dysuria.  No urinary urgency or hesitancy.  No hematuria.  She is having some vaginal discharge.  No reported odor.  No nausea, vomiting, diarrhea.  No melena or hematochezia.  No abdominal pain.  Her back pain is worse with some position changes.  She has not tried anything for it.  Current Medications (verified): 1)  Ocuflox 0.3 % Soln (Ofloxacin) .Marland Kitchen.. 1 Drop Left Eye 4 Times Daily For 7 Days 2)  Monistat 7  Kit (Miconazole Nitrate) .... Use Per Box Directions 3)  Diflucan 150 Mg Tabs (Fluconazole) .... Take 1 Tablet By Mouth Once A Day X 1 4)  Nu-Iron 150 Mg Caps (Polysaccharide Iron Complex) .... Take 1 Tablet By Mouth Two Times A Day (Pharmacy D/c Feso4) 5)  Hydrochlorothiazide 25 Mg Tabs (Hydrochlorothiazide) .... Take 1 Tablet By Mouth Once A Day 6)  Klor-Con M20 20 Meq Cr-Tabs (Potassium Chloride Crys Cr) .... Take 1 Tablet By Mouth Once A Day  Allergies (verified): No Known Drug Allergies  Physical Exam  General:  alert, well-developed, and well-nourished.   Head:  normocephalic and atraumatic.   Lungs:   normal breath sounds.   Heart:  normal rate and regular rhythm.   Abdomen:  soft, non-tender, no masses, no hepatomegaly, and no splenomegaly.   Msk:  no spinal tend with palp neg SLR bilat no CVA tend bilat  Neurologic:  alert & oriented X3 and cranial nerves II-XII intact.   Psych:  normally interactive.     Impression & Recommendations:  Problem # 1:  VAGINITIS, BACTERIAL (ICD-616.10) flagyl f/u as needed  Her updated medication list for this problem includes:    Monistat 7 Kit (Miconazole nitrate) ..... Use per box directions    Metronidazole 500 Mg Tabs (Metronidazole) .Marland Kitchen... Take 1 tablet by mouth two times a day until all gone  Problem # 2:  LUMBAR STRAIN (ICD-847.2) naproxen heat two times a day to three times a day f/u as needed send urine for cx .. . doubt UTI causing this  Problem # 3:  ANEMIA-IRON DEFICIENCY (ICD-280.9) pt to try to complete stool cards  Her updated medication list for this problem includes:    Nu-iron 150 Mg Caps (Polysaccharide iron complex) .Marland Kitchen... Take 1 tablet by mouth two times a day (pharmacy d/c feso4)  Complete Medication List: 1)  Ocuflox 0.3 % Soln (Ofloxacin) .Marland Kitchen.. 1 drop  left eye 4 times daily for 7 days 2)  Monistat 7 Kit (Miconazole nitrate) .... Use per box directions 3)  Diflucan 150 Mg Tabs (Fluconazole) .... Take 1 tablet by mouth once a day x 1 4)  Nu-iron 150 Mg Caps (Polysaccharide iron complex) .... Take 1 tablet by mouth two times a day (pharmacy d/c feso4) 5)  Hydrochlorothiazide 25 Mg Tabs (Hydrochlorothiazide) .... Take 1 tablet by mouth once a day 6)  Klor-con M20 20 Meq Cr-tabs (Potassium chloride crys cr) .... Take 1 tablet by mouth once a day 7)  Naprosyn 500 Mg Tabs (Naproxen) .... Take 1 tablet by mouth with food  two times a day as needed for pain 8)  Metronidazole 500 Mg Tabs (Metronidazole) .... Take 1 tablet by mouth two times a day until all gone  Other Orders: T-Culture, Urine ([id number])  Patient  Instructions: 1)  Apply heat to back two times a day to three times a day. 2)  Take naprosyn two times a day as needed for pain.  Try taking it 2-3 days in a row at first, then as needed after that. 3)  Follow up as needed for this problem.  Return sooner if it feels worse or does not get better. Prescriptions: METRONIDAZOLE 500 MG TABS (METRONIDAZOLE) Take 1 tablet by mouth two times a day until all gone  #14 x 0   Entered and Authorized by:   Tereso Newcomer PA-C   Signed by:   Tereso Newcomer PA-C on 04/27/2009   Method used:   Print then Give to Patient   RxID:   [id number] NAPROSYN 500 MG TABS (NAPROXEN) Take 1 tablet by mouth with food  two times a day as needed for pain  #30 x 1   Entered and Authorized by:   Tereso Newcomer PA-C   Signed by:   Tereso Newcomer PA-C on 04/27/2009   Method used:   Print then Give to Patient   RxID:   [id number]   Laboratory Results   Urine Tests  Date/Time Received: April 27, 2009 12:17 PM   Routine Urinalysis   Glucose: negative   (Normal Range: Negative) Bilirubin: negative   (Normal Range: Negative) Ketone: negative   (Normal Range: Negative) Spec. Gravity: 1.015   (Normal Range: 1.003-1.035) Blood: negative   (Normal Range: Negative) pH: 7.0   (Normal Range: 5.0-8.0) Protein: 100   (Normal Range: Negative) Urobilinogen: 0.2   (Normal Range: 0-1) Nitrite: negative   (Normal Range: Negative) Leukocyte Esterace: trace   (Normal Range: Negative)      [place]et [place] Source: vaginal WBC/hpf: 1-5 Bacteria/hpf: rare Clue cells/hpf: few  Positive whiff Yeast/hpf: none [place] KOH: Negative Trichomonas/hpf: none

## 2010-05-02 NOTE — Letter (Signed)
Summary: Handout Printed  Printed Handout:  - Diet - High-Fiber 

## 2010-05-02 NOTE — Progress Notes (Signed)
Summary: Office Visit//DEPRESSION SCREENING  Office Visit//DEPRESSION SCREENING   Imported By: Arta Bruce 02/13/2010 15:35:44  _____________________________________________________________________  External Attachment:    Type:   Image     Comment:   External Document

## 2010-05-02 NOTE — Miscellaneous (Signed)
Summary: No stool cards  Clinical Lists Changes  Problems: Assessed ANEMIA-IRON DEFICIENCY as comment only - patient asked multiple times to do stool cards and she did not unable to reach patient by phone she did not keep scheduled f/u appt  Her updated medication list for this problem includes:    Nu-iron 150 Mg Caps (Polysaccharide iron complex) .Marland Kitchen... 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 Take 1 tablet by mouth two times a day (pharmacy d/c feso4)  Complete Medication List: 1)  Ocuflox 0.3 % Soln (Ofloxacin) .Marland Kitchen.. 1 drop left eye 4 times daily for 7 days 2)  Monistat 7 Kit (Miconazole nitrate) .... Use per box directions 3)  Diflucan 150 Mg Tabs (Fluconazole) .... Take 1 tablet by mouth once a day x 1 4)  Nu-iron 150 Mg Caps (Polysaccharide iron complex) .... Take 1 tablet by mouth two times a day (pharmacy d/c feso4) 5)  Hydrochlorothiazide 25 Mg Tabs (Hydrochlorothiazide) .... Take 1 tablet by mouth once a day 6)  Klor-con M20 20 Meq Cr-tabs (Potassium chloride crys cr) .... Take 1 tablet by mouth once a day 7)  Naprosyn 500 Mg Tabs (Naproxen) .... Take 1 tablet by mouth with food  two times a day as needed for pain

## 2011-01-02 ENCOUNTER — Other Ambulatory Visit: Payer: Self-pay | Admitting: Internal Medicine

## 2011-01-02 DIAGNOSIS — Z1231 Encounter for screening mammogram for malignant neoplasm of breast: Secondary | ICD-10-CM

## 2011-01-10 ENCOUNTER — Ambulatory Visit (HOSPITAL_COMMUNITY): Payer: Self-pay | Attending: Internal Medicine

## 2012-02-09 ENCOUNTER — Encounter (HOSPITAL_COMMUNITY): Payer: Self-pay | Admitting: *Deleted

## 2012-02-23 ENCOUNTER — Other Ambulatory Visit: Payer: Self-pay | Admitting: Obstetrics and Gynecology

## 2012-02-23 DIAGNOSIS — Z1231 Encounter for screening mammogram for malignant neoplasm of breast: Secondary | ICD-10-CM

## 2012-02-24 ENCOUNTER — Ambulatory Visit (HOSPITAL_COMMUNITY): Payer: Self-pay | Attending: Obstetrics and Gynecology

## 2012-02-24 ENCOUNTER — Ambulatory Visit (HOSPITAL_COMMUNITY): Payer: Self-pay

## 2012-02-25 ENCOUNTER — Ambulatory Visit (INDEPENDENT_AMBULATORY_CARE_PROVIDER_SITE_OTHER): Payer: Self-pay | Admitting: Family Medicine

## 2012-02-25 ENCOUNTER — Encounter: Payer: Self-pay | Admitting: Family Medicine

## 2012-02-25 VITALS — BP 200/97 | HR 78 | Ht 64.0 in | Wt 140.8 lb

## 2012-02-25 DIAGNOSIS — I10 Essential (primary) hypertension: Secondary | ICD-10-CM

## 2012-02-25 DIAGNOSIS — F17200 Nicotine dependence, unspecified, uncomplicated: Secondary | ICD-10-CM

## 2012-02-25 MED ORDER — LISINOPRIL-HYDROCHLOROTHIAZIDE 20-12.5 MG PO TABS: 1.0000 | ORAL_TABLET | Freq: Every day | ORAL | Status: DC

## 2012-02-25 NOTE — Patient Instructions (Addendum)
Thank you for coming in today, it was nice to meet you Start the lisinopril-hctz for your blood pressure Apply for the assistance card Try to decrease your soda intake and smoking Follow up with me in two weeks.

## 2012-02-29 ENCOUNTER — Encounter: Payer: Self-pay | Admitting: Family Medicine

## 2012-02-29 NOTE — Assessment & Plan Note (Signed)
Counseled on quitting.  She plans to cut back on the number of cigarettes she smokes each day.

## 2012-02-29 NOTE — Assessment & Plan Note (Signed)
BP elevated today, manual recheck 190/95.  No red flags and completely asymptomatic with pressure this high.  Will start her back on lisinopril-hctz, at low dose and have her follow up closely.

## 2012-02-29 NOTE — Progress Notes (Signed)
  Subjective:    Patient ID: Nicole Barrera, female    DOB: 11-30-1960, 51 y.o.   MRN: [id number]  HPI Patient is a new patient to our practice here to establish care and discuss  1. HTN:  Was formerly a patient at [facility].  Has not seen physician since practice closed.  Previously reports to have been on Lisiniopril-Hctz for blood pressure, but has been out x2 months.  Tolerated this in the past.  She does not monitor her blood pressure at home or follow any particular diet.  She denies any symptoms today including headache, chest pain, shortness of breath, vision changes, or palpitations.    2. Tobacco dependence:  1/2 PPD smoker since she was a teenager.  Desires to stop but does not feel like she is at a point right now that she can stop.  She has quit in the past for a few months.  Her goals right now are to decrease the number of cigarettes she smokes per day.    Review of Systems Per HPI    Objective:   Physical Exam  Constitutional: She appears well-nourished. No distress.  HENT:  Head: Normocephalic and atraumatic.  Eyes: EOM are normal. Pupils are equal, round, and reactive to light.  Neck: Neck supple. No thyromegaly present.  Cardiovascular: Normal rate and regular rhythm.   Murmur (2/6 SEM heard throughout) heard. Pulmonary/Chest: Effort normal and breath sounds normal. She has no wheezes.  Musculoskeletal: She exhibits no edema.  Neurological: She is alert.          Assessment & Plan:

## 2012-03-10 ENCOUNTER — Ambulatory Visit (INDEPENDENT_AMBULATORY_CARE_PROVIDER_SITE_OTHER): Payer: Self-pay | Admitting: *Deleted

## 2012-03-10 VITALS — BP 180/100 | HR 68

## 2012-03-10 DIAGNOSIS — I10 Essential (primary) hypertension: Secondary | ICD-10-CM

## 2012-03-10 NOTE — Progress Notes (Signed)
In for BP check . BP checked manually using regular adult cuff.  BP first checked RA 190/100 and LA 174/100.  Patient waited another 10 minutes and BP rechecked  LA 170/98 and RA 180/100 . She is taking medication as directed.  Consulted with Dr. Earnest Bailey and she advises for patient to return for appointment with MD to follow up regarding BP. Appointment scheduled tomorrow.

## 2012-03-11 ENCOUNTER — Ambulatory Visit (INDEPENDENT_AMBULATORY_CARE_PROVIDER_SITE_OTHER): Payer: Self-pay | Admitting: Family Medicine

## 2012-03-11 ENCOUNTER — Encounter: Payer: Self-pay | Admitting: Family Medicine

## 2012-03-11 VITALS — BP 168/93 | HR 85 | Ht 63.5 in | Wt 136.7 lb

## 2012-03-11 DIAGNOSIS — I10 Essential (primary) hypertension: Secondary | ICD-10-CM

## 2012-03-11 NOTE — Progress Notes (Signed)
S: Pt comes in today for follow up of HTN.  Has had multiple very high readings (>180/100) over the past few weeks.  Was seen by nursing yesterday for BP check, and BP was consistently 170-190/98-100 despite checking in both arms and after a rest period.  Today, patient reports that she is feeling well.  Is currently only on lisinopril/HCTZ 20/12.5.  Is taking it every morning at 10 AM.  Denies chest pain, SOB, LE edema, N/V, dizziness, headache, vision changes.  Patient reports she was previously on lisinopril/HCTZ 20/25 and was well controlled (BPs 130-150/80s).   Currently has congestion, but is not taking any OTC medications for this.    ROS: Per HPI  History  Smoking status  . Current Every Day Smoker -- 0.5 packs/day  . Types: Cigarettes  Smokeless tobacco  . Not on file    O:  Filed Vitals:   03/11/12 1443  BP: 168/93  Pulse: 85    Gen: NAD CV: RRR, no murmur Pulm: CTA bilat, no wheezes or crackles Ext: Warm, no edema   A/P: 51 y.o. female p/w HTN -See problem list -f/u in 1-2 weeks

## 2012-03-11 NOTE — Assessment & Plan Note (Addendum)
Elevated today, but better.  Asymptomatic.  Increase lisinopril/HCTZ to 40/25.  Needs BMET check soon-- not sure it can wait until assistance card goes through.  F/u 1 week for recheck.

## 2012-03-11 NOTE — Patient Instructions (Addendum)
It was nice to meet you today.  Your blood pressure is a little better than yesterday, which is great!  Keep up the great working taking your pill the same time every day.  We are increasing the dose of your medicine- take TWO pills every day at the same time.  Go back down to 1 pill per day if you start having any dizziness, especially when going from sitting to standing or laying to sitting.  Come back in 1 week to have your blood pressure rechecked.  We need to get some labs on your soon as well.  For your congestion, you can take Coricidin, which is over the counter but won't make your blood pressure higher.   Make sure you make an appointment with Britta Mccreedy to apply for the assistance card.

## 2012-03-18 ENCOUNTER — Ambulatory Visit (INDEPENDENT_AMBULATORY_CARE_PROVIDER_SITE_OTHER): Payer: Self-pay | Admitting: Family Medicine

## 2012-03-18 ENCOUNTER — Encounter: Payer: Self-pay | Admitting: Family Medicine

## 2012-03-18 ENCOUNTER — Ambulatory Visit: Payer: Self-pay | Admitting: Family Medicine

## 2012-03-18 VITALS — BP 150/90 | HR 75 | Temp 98.7°F | Ht 63.5 in | Wt 138.0 lb

## 2012-03-18 DIAGNOSIS — I10 Essential (primary) hypertension: Secondary | ICD-10-CM

## 2012-03-18 LAB — BASIC METABOLIC PANEL
BUN: 15 mg/dL (ref 6–23)
Chloride: 104 mEq/L (ref 96–112)
Creat: 0.73 mg/dL (ref 0.50–1.10)
Glucose, Bld: 81 mg/dL (ref 70–99)
Potassium: 3.6 mEq/L (ref 3.5–5.3)

## 2012-03-18 NOTE — Progress Notes (Signed)
S: Pt comes in today for blood pressure follow up.  HYPERTENSION BP: 174/95 Meds: lisino/HCTZ 40/25 Taking meds: Yes     # of doses missed/week: 1 Symptoms: Headache: No Dizziness: No Vision changes: No SOB:  No Chest pain: No LE swelling: No Tobacco use: Yes No OTC medicines  Eats minimal salt in diet (1200mg ); husband doesn't cook with salt, doesn't add salt    ROS: Per HPI  History  Smoking status  . Current Every Day Smoker -- 0.5 packs/day  . Types: Cigarettes  Smokeless tobacco  . Not on file    O:  Filed Vitals:   03/18/12 1551  BP: 174/95  Pulse: 75  Temp: 98.7 F (37.1 C)  Repeat BP: 150/90   Gen: NAD CV: RRR, no murmur Pulm: CTA bilat, no wheezes or crackles Ext: Warm, no edema   A/P: 51 y.o. female p/w HTN -See problem list -f/u in 1-2 weeks with PCP

## 2012-03-18 NOTE — Assessment & Plan Note (Signed)
Pt would really like to wait to add a 3rd agent (maxed out on lisino/HCTZ 40/25).  Will have her niece who is a nurse check her BP at home.  Would like to give the higher dose of BP meds longer than 1 week to see if it will help.  Only missed 1 dose.  Will f/u in 7-10 days with list of at least 3-4 home BPs.  If they appear WNL would consider referral to Pharm clinic for Amb BP monitoring.  If still elevated, consider addition of BB or norvasc or changing HCTZ to chlorthalidone.

## 2012-03-18 NOTE — Patient Instructions (Signed)
It was good to see you today.  Try to have your niece check your blood pressure a few times and write those numbers down and bring them in.  We will wait to change or add medicines until we see what your pressure is at home.    We checked some blood work today-- I'll call if it is abnormal otherwise I will send a letter.  Come back in 7-10 days.

## 2012-03-19 ENCOUNTER — Encounter: Payer: Self-pay | Admitting: Family Medicine

## 2012-03-25 ENCOUNTER — Ambulatory Visit: Payer: Self-pay | Admitting: Family Medicine

## 2012-04-02 ENCOUNTER — Ambulatory Visit: Payer: Self-pay | Admitting: Family Medicine

## 2012-04-06 ENCOUNTER — Ambulatory Visit: Payer: Self-pay | Admitting: Family Medicine

## 2012-04-14 ENCOUNTER — Ambulatory Visit: Payer: Self-pay | Admitting: Family Medicine

## 2012-04-16 ENCOUNTER — Ambulatory Visit: Payer: Self-pay | Admitting: Family Medicine

## 2012-04-21 ENCOUNTER — Encounter: Payer: Self-pay | Admitting: Family Medicine

## 2012-04-21 ENCOUNTER — Ambulatory Visit (INDEPENDENT_AMBULATORY_CARE_PROVIDER_SITE_OTHER): Payer: Self-pay | Admitting: Family Medicine

## 2012-04-21 VITALS — BP 171/91 | HR 80 | Ht 63.5 in | Wt 135.0 lb

## 2012-04-21 DIAGNOSIS — F17200 Nicotine dependence, unspecified, uncomplicated: Secondary | ICD-10-CM

## 2012-04-21 DIAGNOSIS — I10 Essential (primary) hypertension: Secondary | ICD-10-CM

## 2012-04-21 MED ORDER — LISINOPRIL-HYDROCHLOROTHIAZIDE 20-12.5 MG PO TABS: 1.0000 | ORAL_TABLET | Freq: Every day | ORAL | Status: DC

## 2012-04-21 MED ORDER — AMLODIPINE BESYLATE 5 MG PO TABS: 5.0000 mg | ORAL_TABLET | Freq: Every day | ORAL | Status: DC

## 2012-04-21 NOTE — Assessment & Plan Note (Signed)
Counseled to quit.  Given number to [facility] Quit line

## 2012-04-21 NOTE — Assessment & Plan Note (Signed)
BP 144/88 on manual recheck today by me.  I think she would benefit from low dose amlodipine to help gain better control.  Will see her back in one month.

## 2012-04-21 NOTE — Patient Instructions (Addendum)
Thank you for coming in today, it was good to see you Your blood pressure on recheck looked better Try to continue to quit smoking.  It is a good idea to set a quit date for you.  You can call the [facility] Quit line at [phone]-Quit-Now Start the amlodipine and follow up with me in 1 month.

## 2012-04-21 NOTE — Progress Notes (Signed)
  Subjective:    Patient ID: Nicole Barrera, female    DOB: November 06, 1960, 52 y.o.   MRN: [id number]  HPI  1. HTN:  CHRONIC HYPERTENSION  Disease Monitoring  Blood pressure range: Niece checking at home for her ranging from 140-150 systolic/90-100 diastolic  Chest pain: no   Dyspnea: no   Claudication: no   Medication compliance: yes  Medication Side Effects  Lightheadedness: no   Urinary frequency: no   Edema: no   Preventitive Healthcare:  Exercise: yes, walking occasionally   Diet Pattern: generally pretty healthy  Salt Restriction: Yes, limits to 1500-2000mg  per day and does not add additional salt    2. Tobacco abuse:  States that she is trying to quit.  Plans to set a quit date for herself but has not decided on a good date.  She is down from 0.5 ppd to 0.25ppd.    Review of Systems Per HPI    Objective:   Physical Exam  Constitutional: She appears well-nourished. No distress.  HENT:  Head: Normocephalic and atraumatic.  Eyes: No scleral icterus.  Neck: Neck supple. No thyromegaly present.  Cardiovascular: Normal rate and regular rhythm.   Pulmonary/Chest: Effort normal and breath sounds normal. No respiratory distress.  Musculoskeletal: She exhibits no edema.          Assessment & Plan:

## 2012-06-23 ENCOUNTER — Ambulatory Visit (INDEPENDENT_AMBULATORY_CARE_PROVIDER_SITE_OTHER): Payer: Self-pay | Admitting: Family Medicine

## 2012-06-23 ENCOUNTER — Encounter: Payer: Self-pay | Admitting: Family Medicine

## 2012-06-23 VITALS — BP 147/89 | HR 91 | Temp 98.1°F | Ht 64.0 in | Wt 134.6 lb

## 2012-06-23 DIAGNOSIS — R220 Localized swelling, mass and lump, head: Secondary | ICD-10-CM

## 2012-06-23 DIAGNOSIS — I10 Essential (primary) hypertension: Secondary | ICD-10-CM

## 2012-06-23 MED ORDER — HYDROCHLOROTHIAZIDE 25 MG PO TABS: 25.0000 mg | ORAL_TABLET | Freq: Every day | ORAL | Status: DC

## 2012-06-23 MED ORDER — AMOXICILLIN 500 MG PO CAPS: 500.0000 mg | ORAL_CAPSULE | Freq: Three times a day (TID) | ORAL | Status: DC

## 2012-06-23 NOTE — Patient Instructions (Addendum)
STOP taking Lisinopril.  This may be causing your angioedema (lip swelling). Purchase Benadryl 25 mg 1 tablet every 4-5 hours swelling.  For possible tooth infection, take Amoxicillin three times per day x 10 days. Eat a low sodium diet for now and continue Amlodipine. Schedule follow up appointment with Dr. Ashley Royalty in ONE week to recheck your blood pressure and your lip swelling.

## 2012-06-23 NOTE — Progress Notes (Signed)
  Subjective:    Patient ID: Nicole Barrera, female    DOB: Aug 05, 1960, 52 y.o.   MRN: [id number]  HPI  Facial swelling: Located LT lower cheek and jaw Started yesterday, after she used new lip gloss and drinking pineapple soda. She thinks she may have had an allergic reaction to one of the two. This morning she says bottom lip was big and swollen, upper lip was also swollen but this resolved. Feels soreness where swelling is, but not particularly painful or tender. She did not take Lisinopril yesterday, but she took one today. Denies any toothache, associated fever, chills, nausea/vomiting. She admits to poor dentition and just got an orange card recently so she can see a dentist.  She started Lisinopril in January 2014. Denies any tongue swelling, No difficulty eating, swallowing food. No difficulty breathing or SOB.  Review of Systems Per HPI    Objective:   Physical Exam  Constitutional: She appears well-nourished. No distress.  HENT:  Head: Normocephalic and atraumatic.  LT side of face (cheek and jaw area) more swollen than RT; no evidence of abscess on outside or inside of mouth; mild TTP of LT jaw line; lower lip on LT is much more swollen than RT; no erythema or induration noted  Cardiovascular: Normal rate.   No murmur heard. Pulmonary/Chest: Effort normal and breath sounds normal.   Filed Vitals:   06/23/12 1607  BP: 147/89  Pulse: 91  Temp: 98.1 F (36.7 C)      Assessment & Plan:

## 2012-06-27 ENCOUNTER — Encounter: Payer: Self-pay | Admitting: Family Medicine

## 2012-06-27 DIAGNOSIS — R220 Localized swelling, mass and lump, head: Secondary | ICD-10-CM | POA: Insufficient documentation

## 2012-06-27 NOTE — Assessment & Plan Note (Addendum)
Swelling of lips and left side of face could be secondary to angioedema reaction to ACE-i.  However, patient also has poor dentition with several missing teeth and caries.  She also has mild tenderness on palpation of LT mandible. No tongue swelling, difficulty swallowing or breathing at this time.  - Plan to discontinue Lisinopril (added this to her allergy list) - Will treat possible dental infection with Amoxicillin - Red flags reviewed, follow up in 1-2 weeks with PCP or sooner as needed

## 2012-06-27 NOTE — Assessment & Plan Note (Addendum)
Stopped Lisinopril due to angioedema.  Increased HCTZ to 25 mg daily.  Continue Norvasc.  Recheck BP in 1-2 weeks.

## 2012-07-12 ENCOUNTER — Ambulatory Visit: Payer: Self-pay | Admitting: Family Medicine

## 2012-07-26 ENCOUNTER — Ambulatory Visit (INDEPENDENT_AMBULATORY_CARE_PROVIDER_SITE_OTHER): Payer: Self-pay | Admitting: Family Medicine

## 2012-07-26 ENCOUNTER — Encounter: Payer: Self-pay | Admitting: Family Medicine

## 2012-07-26 VITALS — BP 147/84 | HR 69 | Ht 63.5 in | Wt 132.0 lb

## 2012-07-26 DIAGNOSIS — I10 Essential (primary) hypertension: Secondary | ICD-10-CM

## 2012-07-26 MED ORDER — AMLODIPINE BESYLATE 5 MG PO TABS: 5.0000 mg | ORAL_TABLET | Freq: Every day | ORAL | Status: DC

## 2012-07-26 NOTE — Assessment & Plan Note (Signed)
BP remains mildly elevated.  Currently on monotherapy with HCTZ.  Will add back on amlodipine.  Amlodipine $16 at [facility] aide for 90 day supply will send in there and have her follow upin 6-8 weeks.

## 2012-07-26 NOTE — Progress Notes (Signed)
  Subjective:    Patient ID: ARLAYNE LIGGINS, female    DOB: Sep 30, 1960, 52 y.o.   MRN: [id number]  Hypertension   1. HTN:  Here for follow up of HTN.  Was seen a few weeks ago for swelling in her lips and mouth, she thinks is from a lip balm she was using.  Concern for possible angioedema at that time, lisinopril stopped.  No further swelling since that time.  HCTZ was increased to 25mg  as well.  She is not taking amlodipine because she could not afford at [facility].  She does not monitor her BP at home and continues to smoke.  She does walk daily and watches salt intake.  She denies chest pain, headache, vision changes, palpitations.     Review of Systems Per HPI    Objective:   Physical Exam  Constitutional: She appears well-nourished. No distress.  HENT:  Head: Normocephalic and atraumatic.  Cardiovascular: Normal rate and regular rhythm.   Pulmonary/Chest: Effort normal and breath sounds normal.  Musculoskeletal: She exhibits no edema.          Assessment & Plan:

## 2012-07-26 NOTE — Patient Instructions (Addendum)
Thank you for coming in today, it was good to see you I have sent the amlodipine to [facility] on [address] Follow up with me in 2 months

## 2012-08-26 ENCOUNTER — Other Ambulatory Visit: Payer: Self-pay | Admitting: Family Medicine

## 2012-09-28 ENCOUNTER — Ambulatory Visit: Payer: Self-pay | Admitting: Family Medicine

## 2012-10-04 ENCOUNTER — Other Ambulatory Visit: Payer: Self-pay | Admitting: *Deleted

## 2012-10-04 ENCOUNTER — Emergency Department (INDEPENDENT_AMBULATORY_CARE_PROVIDER_SITE_OTHER)
Admission: EM | Admit: 2012-10-04 | Discharge: 2012-10-04 | Disposition: A | Discharge status: Home / Self Care | Payer: Self-pay | Source: Home / Self Care | Attending: Emergency Medicine | Admitting: Emergency Medicine

## 2012-10-04 ENCOUNTER — Encounter (HOSPITAL_COMMUNITY): Payer: Self-pay | Admitting: Emergency Medicine

## 2012-10-04 ENCOUNTER — Emergency Department (INDEPENDENT_AMBULATORY_CARE_PROVIDER_SITE_OTHER): Payer: Self-pay

## 2012-10-04 DIAGNOSIS — M6530 Trigger finger, unspecified finger: Secondary | ICD-10-CM

## 2012-10-04 MED ORDER — MELOXICAM 7.5 MG PO TABS: 7.5000 mg | ORAL_TABLET | Freq: Every day | ORAL | Status: DC

## 2012-10-04 MED ORDER — HYDROCHLOROTHIAZIDE 25 MG PO TABS: ORAL_TABLET | ORAL | Status: DC

## 2012-10-04 NOTE — ED Notes (Signed)
Reports thumb pain for one week.  Patient reports playing with grandson and not sure if jammed or bent backwards , but thumb hurting since then.  Thumb pops and painful, has been using thumb brace

## 2012-10-04 NOTE — ED Provider Notes (Signed)
History    CSN: [id number] Arrival date & time 10/04/12  1724  First MD Initiated Contact with Patient 10/04/12 1735     Chief Complaint  Patient presents with  . Hand Pain   (Consider location/radiation/quality/duration/timing/severity/associated sxs/prior Treatment) HPI Comments: Patient presents to urgent care this evening describing pain on her left thumb for about a week. Patient reports that she has been playing with her grandson that " a bit big", and she is unaware of whether or not she jammed or injure her thumb in any way. At times she feels any ears popping noses coming from the distal joint on her left palm. A couple of occasions her joint has gotten " stuck" it pops and it hurts. She did use a thumb brace that she bought over-the-counter with some partial relief has not taken any medicines for it. She denies excessive usage of her hands as in a repetitive motion activity chronically. Denies any previous fractures or injuries to the affected thumb.  Patient denies any associated paresthesias such as numbness or tingling sensation to the affected thumb or proximally or distally of her hand  Patient is a 52 y.o. female presenting with hand pain. The history is provided by the patient.  Hand Pain This is a new problem. The current episode started more than 1 week ago. The problem occurs constantly. The problem has not changed since onset.Pertinent negatives include no abdominal pain, no headaches and no shortness of breath. Exacerbated by: Movement and activity and flexion of distal phalanx. The symptoms are relieved by ice and rest. The treatment provided no relief.   Past Medical History  Diagnosis Date  . HTN (hypertension)   . Tobacco abuse   . 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 PEPTIC ULCER DISEASE 11/19/2006    Qualifier: Diagnosis of  By: Barbaraann Barthel MD, [place]     History reviewed. No pertinent past surgical  history. History reviewed. No pertinent family history. History  Substance Use Topics  . Smoking status: Current Every Day Smoker -- 0.50 packs/day    Types: Cigarettes  . Smokeless tobacco: Not on file  . Alcohol Use: Not on file   OB History   Grav Para Term Preterm Abortions TAB SAB Ect Mult Living                 Review of Systems  Constitutional: Negative for fever, chills, appetite change and fatigue.  Respiratory: Negative for shortness of breath.   Gastrointestinal: Negative for abdominal pain.  Musculoskeletal: Positive for joint swelling. Negative for myalgias, back pain and arthralgias.  Skin: Negative for color change, pallor, rash and wound.  Neurological: Negative for weakness, numbness and headaches.    Allergies  Lisinopril  Home Medications   Current Outpatient Rx  Name  Route  Sig  Dispense  Refill  . amLODipine (NORVASC) 5 MG tablet   Oral   Take 1 tablet (5 mg total) by mouth daily.   90 tablet   3   . hydrochlorothiazide (HYDRODIURIL) 25 MG tablet      TAKE ONE TABLET BY MOUTH ONCE DAILY   30 tablet   5   . meloxicam (MOBIC) 7.5 MG tablet   Oral   Take 1 tablet (7.5 mg total) by mouth daily.   14 tablet   0    BP 177/100  Pulse 77  Temp(Src) 98.7 F (37.1 C) (Oral)  Resp 16  SpO2 100%  Physical Exam  Nursing note and vitals reviewed. Constitutional: Vital signs are normal. She appears well-developed and well-nourished.  Neck: Neck supple.  Abdominal: Soft.  Musculoskeletal: She exhibits tenderness.       Hands: Neurological: She is alert.  Skin: No rash noted. No erythema. No pallor.    ED Course  Procedures (including critical care time) Labs Reviewed - No data to display Dg Finger Thumb Left  10/04/2012   *RADIOLOGY REPORT*  Clinical Data: Left thumb hyperextension.  Pain.  LEFT THUMB 2+V  Comparison: None.  Findings: No acute bony abnormality.  Specifically, no fracture, subluxation, or dislocation.  Soft tissues are intact.  Joint spaces are maintained.  Normal bone mineralization.  IMPRESSION: No acute bony abnormality.   Original Report Authenticated By: Charlett Nose, M.D.   1. Trigger finger, acquired     MDM   Plan of care:  Trigger finger of left thumb. Have immobilize her thumb with a thumb spica. Prescribe a course of a Cox 2 inhibitor for 2 weeks Encourage her to followup hand orthopedic Dr. next 2 weeks for further treatment and I have explained to her The indication for a local steroid injection, with an orthopedic provider. He understands treatment plan and followup care as discussed. Discharge Medication List as of 10/04/2012  6:34 PM    START taking these medications   Details  hydrochlorothiazide (HYDRODIURIL) 25 MG tablet TAKE ONE TABLET BY MOUTH ONCE DAILY, Normal    meloxicam (MOBIC) 7.5 MG tablet Take 1 tablet (7.5 mg total) by mouth daily., Starting 10/04/2012, Until Discontinued, Normal        Jimmie Molly, MD 10/04/12 [phone]

## 2012-10-04 NOTE — Telephone Encounter (Signed)
Patient is calling to let the doctor know that she is completely out of her HCTZ so if he can fill it quickly, she would appreciate it.

## 2012-10-05 NOTE — Telephone Encounter (Signed)
Patient informed. Nicole Barrera S  

## 2012-10-11 ENCOUNTER — Other Ambulatory Visit: Payer: Self-pay | Admitting: *Deleted

## 2012-10-11 MED ORDER — HYDROCHLOROTHIAZIDE 25 MG PO TABS: ORAL_TABLET | ORAL | Status: DC

## 2012-11-18 ENCOUNTER — Encounter: Payer: Self-pay | Admitting: Family Medicine

## 2012-11-18 ENCOUNTER — Ambulatory Visit (INDEPENDENT_AMBULATORY_CARE_PROVIDER_SITE_OTHER): Payer: No Typology Code available for payment source | Admitting: Family Medicine

## 2012-11-18 VITALS — BP 152/97 | HR 82 | Ht 64.0 in | Wt 132.0 lb

## 2012-11-18 DIAGNOSIS — M6530 Trigger finger, unspecified finger: Secondary | ICD-10-CM

## 2012-11-18 DIAGNOSIS — M65312 Trigger thumb, left thumb: Secondary | ICD-10-CM | POA: Insufficient documentation

## 2012-11-18 DIAGNOSIS — F17200 Nicotine dependence, unspecified, uncomplicated: Secondary | ICD-10-CM

## 2012-11-18 DIAGNOSIS — I10 Essential (primary) hypertension: Secondary | ICD-10-CM

## 2012-11-18 MED ORDER — AMLODIPINE BESYLATE 5 MG PO TABS: 5.0000 mg | ORAL_TABLET | Freq: Every day | ORAL | Status: DC

## 2012-11-18 NOTE — Patient Instructions (Addendum)
Dear Nicole Barrera, Thank you for coming in to clinic today. It was good to meet you!  Today we discussed your Trigger Thumb, Blood Pressure, and Smoking. 1. You definitely have a Trigger Thumb, as I am able to feel the swollen nodule in your tendon. We discussed proceeding with an injection, and would like to get you scheduled for that as soon as we can. It may take up to 2 injections to get full relief. Otherwise, if they are not working, then we will work on referring you to an Investment banker, operational. 2. I would say continue what you are doing about the splint, moving, massaging your thumb. Movement will help keep it loose, but you don't want to strain it if it is painful. 3. Your blood pressure is still elevated today. As we discussed, it will be important to try the second medication (see below for details). I have sent it into the [facility] you told me, and they should have it. 4. Continue taking your current BP med, but add the new one as well.  Smoking Quit Date - January 09, 2013 - (Happy 2nd Birthday to your grandson!) Please do not hesitate to come back and see me if you need any other help or assistance with your smoking. Dr. Raymondo Barrera is a good resource as well if you would like to see him for help.  We started a new medication today to help your blood pressure. Amlodipine 5mg , please take 1 tablet once a day as we discussed.  Some important numbers from today's visit: BP - 152/97 - This is elevated, but if you continue to stay active, eat right, and take your BP meds, I think we can get this lower. Smoking is a major contributor to high blood pressure! If you are able to quit, that will help tremendously, and may be able to reduce your medications!  Please schedule a follow-up appointment with me as soon as possible for a Procedure (Steroid Injection, in hand for Trigger Thumb), please schedule this ONLY if Dr. Jennette Barrera is a preceptor in clinic. Otherwise, if there are no openings within 1  month, please go ahead and schedule apt with Dr. Jennette Barrera for the injection.  Also, you will need a nurse visit BP check appointment in 2 weeks, with a FASTING LAB ONLY (BMET, LIPID PANEL) appointment on the same day as BP check.  If you have any other questions or concerns, please feel free to call the clinic to contact me. You may also schedule an earlier appointment if necessary.  However, if your symptoms get significantly worse, please go to the Emergency Department to seek immediate medical attention.  Nicole Pilar, DO [facility]    Trigger Point Injection Trigger points are areas where you have muscle pain. A trigger point injection is a shot given in the trigger point to relieve that pain. A trigger point might feel like a knot in your muscle. It hurts to press on a trigger point. Sometimes the pain spreads out (radiates) to other parts of the body. For example, pressing on a trigger point in your shoulder might cause pain in your arm or neck. You might have one trigger point. Or, you might have more than one. People often have trigger points in their upper back and lower back. They also occur often in the neck and shoulders. Pain from a trigger point lasts for a long time. It can make it hard to keep moving. You might not be able to  do the exercise or physical therapy that could help you deal with the pain. A trigger point injection may help. It does not work for everyone. But, it may relieve your pain for a few days or a few months. A trigger point injection does not cure long-lasting (chronic) pain. LET YOUR CAREGIVER KNOW ABOUT:  Any allergies (especially to latex, lidocaine, or steroids).  Blood-thinning medicines that you take. These drugs can lead to bleeding or bruising after an injection. They include:  Aspirin.  Ibuprofen.  Clopidogrel.  Warfarin.  Other medicines you take. This includes all vitamins, herbs, eyedrops, over-the-counter medicines,  and creams.  Use of steroids.  Recent infections.  Past problems with numbing medicines.  Bleeding problems.  Surgeries you have had.  Other health problems. RISKS AND COMPLICATIONS A trigger point injection is a safe treatment. However, problems may develop, such as:  Minor side effects usually go away in 1 to 2 days. These may include:  Soreness.  Bruising.  Stiffness.  More serious problems are rare. But, they may include:  Bleeding under the skin (hematoma).  Skin infection.  Breaking off of the needle under your skin.  Lung puncture.  The trigger point injection may not work for you. BEFORE THE PROCEDURE You may need to stop taking any medicine that thins your blood. This is to prevent bleeding and bruising. Usually these medicines are stopped several days before the injection. No other preparation is needed. PROCEDURE  A trigger point injection can be given in your caregiver's office or in a clinic. Each injection takes 2 minutes or less.  Your caregiver will feel for trigger points. The caregiver may use a marker to circle the area for the injection.  The skin over the trigger point will be washed with a germ-killing (antiseptic) solution.  The caregiver pinches the spot for the injection.  Then, a very thin needle is used for the shot. You may feel pain or a twitching feeling when the needle enters the trigger point.  A numbing solution may be injected into the trigger point. Sometimes a drug to keep down swelling, redness, and warmth (inflammation) is also injected.  Your caregiver moves the needle around the trigger zone until the tightness and twitching goes away.  After the injection, your caregiver may put gentle pressure over the injection site.  Then it is covered with a bandage. AFTER THE PROCEDURE  You can go right home after the injection.  The bandage can be taken off after a few hours.  You may feel sore and stiff for 1 to 2 days.  Go  back to your regular activities slowly. Your caregiver may ask you to stretch your muscles. Do not do anything that takes extra energy for a few days.  Follow your caregiver's instructions to manage and treat other pain. Document Released: 03/06/2011 Document Revised: 06/09/2011 Document Reviewed: 03/06/2011 [facility] Patient Information 2014 [place], [place].   Trigger Finger Trigger finger (digital tendinitis and stenosing tenosynovitis) is a common disorder that causes an often painful catching of the fingers or thumb. It occurs as a clicking, snapping or locking of a finger in the palm of the hand. The reason for this is that there is a problem with the tendons which flex the fingers sliding smoothly through their sheaths. The cause of this may be inflammation of the tendon and sheath, or from a thickening or nodule in the tendon. The condition may occur in any finger or a couple fingers at the same time. The  cause may be overuse while doing the same activity over and over again with your hands.  Tendons are the tough cords that connect the muscles to bones. Muscles and tendons are part of the system which allows your body to move. When muscles contract in the forearm on the palm side, they pull the tendons toward the elbow and cause the fingers and thumb to bend (flex) toward the palm. These are the flexor tendons. The tendons slide through a slippery smooth membrane (synovium) which is called the tendon sheath. The sheaths have areas of tough fibrous tissues surrounding them which hold the tendons close to the bone. These are called pulleys because they work like a pulley. The first pulley is in the palm of the hand near the crease which runs across your palm. If the area of the tendon thickening is near the pulley, the tendon cannot slide smoothly through the pulley and this causes the trigger finger. The finger may lock with the finger curled or suddenly straighten out with a snap. This is more  common in patients with rheumatoid arthritis and diabetes. Left untreated, the condition may get worse to the point where the finger becomes locked in flexion, like making a fist, or less commonly locked with the finger straightened out. DIAGNOSIS  Your caregiver will easily make this diagnosis on examination. TREATMENT   Splinting for 6 to 8 weeks of time may be helpful. Use the splints as your caregiver suggests.  Heat used for twenty minutes at least four times a day followed by ice packs for twenty minutes unless directed otherwise by your caregiver may be helpful. If you find either heat or cold seems to be making the problem worse, quit using them and ask your caregiver for directions.  Cortisone injections along with splinting may speed up recovery. Several injections may be required. Cortisone may give relief after one injection.  Only take over-the-counter or prescription medicines for pain, discomfort, or fever as directed by your caregiver.  Surgery is another treatment that may be used if conservative treatments using injection and splinting does not work. Surgery can be minor without incisions (a cut does not have to be made) and can be done with a needle through the skin. No stitches are needed and most patients may return to work the same day.  Other surgical choices involve an open procedure where the surgeon opens the hand through a small incision (cut) and cuts the pulley so the tendon can again slide smoothly. Your hand will still work fine. This small operation requires stitches and the recovery will be a little longer and the incisions will need to be protected until completely healed. You may have to limit your activities for up to 6 months.  Occupational or hand therapy may be required if there is stiffness remaining in the finger. RISKS AND COMPLICATIONS Complications are uncommon but some problems that may occur are:  Recurrence of the trigger finger. This does not mean  that the surgery was not well done. It simply means that you may have formed scar tissue following surgery that causes the problem to reoccur.  Infection which could ruin the results of the surgery and can result in a finger which is frozen and can not move normally.  Nerve injury is possible which could result in permanent numbness of one or more fingers. CARE AFTER SURGERY  Elevate your hand above your heart and use ice as instructed.  Follow instructions regarding finger motion/exercise.  Keep the surgical  wound dry for at least 48 hrs or longer if instructed.  Keep your follow-up appointments.  Return to work and normal activities as instructed. SEEK IMMEDIATE MEDICAL CARE IF:  Your problems are getting worse or you do not obtain relief from the treatment. Document Released: 01/05/2004 Document Revised: 06/09/2011 Document Reviewed: 08/29/2008 [facility] Patient Information 2014 [place], [place].

## 2012-11-18 NOTE — Progress Notes (Signed)
Subjective:     Patient ID: Nicole Barrera, female   DOB: 1961/02/23, 52 y.o.   MRN: [id number]  HPI  TRIGGER THUMB, LEFT Reports 2 month hx of her Left Thumb "sticking in place". Intermittent pain in thumb joints, mainly difficulty using thumb due to sticking and poor range of motion. Injury / Trauma? - none Treatment? - seen at Urgent Care 10/04/2012, Left Thumb Xrays negative for FRX, dislocation, given spica thumb splint. Has helped some, but overall minimal improvement with "sticking". Continues to do massage and ROM exercises, with mild relief. Interested in steroid injections or possible surgery.  CHRONIC HYPERTENSION Disease Monitoring  Blood pressure - 152/97, HR 82 - checks at home w/ range 140s/80s  Chest pain: no   Dyspnea: no   Claudication: no  Medication compliance: yes - HCTZ 25mg  daily (was Rx for Amlodipine 5mg  at last visit, but never picked it up from pharmacy) Does not like taking medications, avoids side-effects. Medication Side Effects  Lightheadedness: no   Urinary frequency: no   Edema: no  Preventitive Healthcare:  Exercise: yes   TOBACCO ABUSE Hx smoking 0.5ppd >20 yrs. Reported to be significant decrease from previous amounts Hx of other family members who smoked, and she is "last one" still smoking. She knows that she needs to quit for her health, but has never attempted to quit. Prior Quit Attempts? - none Not interested in medications or nicotine supplements Acknowledges she is almost ready to quit, and would like to set her own Quit Date. Decided on January 09, 2013 - which is her Grandson's 2nd birthday.   Social hx - Smoking (as above), No EtOH  Family Hx - HTN   Review of Systems  See above HPI. Otherwise patient denies fevers, CP, SoB, Headaches, lightheaded/dizziness, Nausea/Vomiting, Abd pain, numbness, tingling, weakness, edema.     Objective:   Physical Exam  BP 152/97  Pulse 82  Ht 5\' 4"  (1.626 m)  Wt 132 lb (59.875 kg)  BMI  22.65 kg/m2  General - pleasant, WDWN, NAD Neck - non-tender, supple, no LAD, no thyromegaly Heart - RRR, no murmurs appreciated Lungs - CTAB w/o wheezing, rhonchi. Good respiratory effort. Ext - no edema, non-tender, +2 peripheral pulses MSK - +palpable knot Left Thumb TMC joint, +thumb sticking when extended from flexed position, Finklestein test (negative DeQuervains), mild edema of Left proximal thumb, decreased ROM flexion/extension, normal circumduction of thumb

## 2012-11-18 NOTE — Assessment & Plan Note (Signed)
Smoking hx 0.5ppd >20 years. Reportedly significant decrease from previous smoking amounts. Hx of other family members who smoked, and she is "last one" still smoking. She knows that she needs to quit for her health, but has never attempted to quit. She is under less stress now, and feels like this may be a good time to quit. Discussed if mentally ready to quit, and coping strategies to help cope without smoking.  Plan: 1. She set her own Quit Date - January 09, 2013 (grandson's 2nd birthday) - Excited about quitting. 2. Offered resources, medication assistance, and further counseling - patient will contact if further help needed.

## 2012-11-18 NOTE — Assessment & Plan Note (Addendum)
Elevated BP today 152/97, HR 82. Patient's nieces are nurses, help her check BP at home frequently, 140s/80s. Currently only taking HCTZ 25mg  daily. Previously prescribed Amlodipine 5mg  daily, but never picked it up from pharmacy. Does not like to take many medications. Avoids medications due to side effects. Current smoker 0.5ppd >20 year hx. Discussed plans to quit.  Plan: 1. Continue HCTZ 25mg  daily 2. Re-prescribed Amlodipine 5mg  daily - pt agrees to start taking this now. 3. Re-check BP 2 weeks (nurse visit) - also Lab Visit at that time for blood work (BMET, Fasting Lipid Panel) 4. Quit smoking date - January 09, 2013

## 2012-11-18 NOTE — Assessment & Plan Note (Addendum)
Reports 2 month hx of her Left Thumb "sticking in place". Intermittent pain in thumb joints, mainly difficulty using thumb due to sticking and poor range of motion. Unknown mechanism of injury, no trauma. Previously seen at Urgent Care 10/04/2012, Thumb Xrays negative for FRX, dislocation. Dx with Trigger Finger at that time, given spica thumb splint for support, provided info on Steroid Injections and Surgery (refer to Orthopedics - did not f/u due to no Ins) Currently symptoms persist, pt wears splint frequently, but also works on ROM exercises, does not take anything for pain, overall minimal improvement in motion and reduced sticking. Exam - palpable knot at Thumb TMC joint, sticking with extension from flexed position, minimal tenderness noted, no evidence of DeQuervain's tenosynovitis  Plan: 1. Scheduled appointment (12/08/12) with Dr. Jennette Kettle - Trigger Thumb steroid injection 2. Advised to continue to wear splint as needed for support 3. May need to refer to Ortho if injections do not help

## 2012-11-30 ENCOUNTER — Other Ambulatory Visit: Payer: No Typology Code available for payment source

## 2012-12-08 ENCOUNTER — Ambulatory Visit: Payer: No Typology Code available for payment source | Admitting: Family Medicine

## 2012-12-14 ENCOUNTER — Other Ambulatory Visit: Payer: No Typology Code available for payment source

## 2012-12-29 ENCOUNTER — Ambulatory Visit (INDEPENDENT_AMBULATORY_CARE_PROVIDER_SITE_OTHER): Payer: No Typology Code available for payment source | Admitting: Family Medicine

## 2012-12-29 ENCOUNTER — Encounter: Payer: Self-pay | Admitting: Family Medicine

## 2012-12-29 VITALS — BP 154/83 | HR 72 | Temp 98.7°F | Ht 64.0 in | Wt 131.0 lb

## 2012-12-29 DIAGNOSIS — M65312 Trigger thumb, left thumb: Secondary | ICD-10-CM

## 2012-12-29 DIAGNOSIS — M6530 Trigger finger, unspecified finger: Secondary | ICD-10-CM

## 2012-12-31 NOTE — Progress Notes (Signed)
  Subjective:    Patient ID: Nicole Barrera, female    DOB: February 21, 1961, 52 y.o.   MRN: [id number]  HPI  Patient here for some injection. He was seen by her PCP for some arthritis she would like to consider an injection. Pain in the left thumb joint joint for the last couple of years, gradually worsening. Over the last couple of months she's also had some sticking of the left thumb and flexion. This is most bothersome for her. She has been wearing a brace which helps some. Has not previously had i shot in her thumb. She is right-hand dominant.  Review of Systems No redness, warmth or swelling in her thumb.    Objective:   Physical Exam Vital signs are reviewed INJECTION: Patient was given informed consent, signed copy in the chart. Appropriate time out was taken. Area prepped and draped in usual sterile fashion. One cc of methylprednisolone 40 mg/ml plus  one cc of 1% lidocaine without epinephrine was injected into the left A1 pulley of the flexor tendon using a(n) lateral approach. The patient tolerated the procedure well. There were no complications. Post procedure instructions were given.        Assessment & Plan:

## 2013-03-02 ENCOUNTER — Ambulatory Visit (INDEPENDENT_AMBULATORY_CARE_PROVIDER_SITE_OTHER): Payer: No Typology Code available for payment source | Admitting: Family Medicine

## 2013-03-02 ENCOUNTER — Telehealth: Payer: Self-pay | Admitting: Family Medicine

## 2013-03-02 ENCOUNTER — Encounter: Payer: Self-pay | Admitting: Family Medicine

## 2013-03-02 VITALS — BP 163/85 | HR 64 | Temp 97.6°F | Ht 64.0 in | Wt 132.0 lb

## 2013-03-02 DIAGNOSIS — K0889 Other specified disorders of teeth and supporting structures: Secondary | ICD-10-CM | POA: Insufficient documentation

## 2013-03-02 DIAGNOSIS — K089 Disorder of teeth and supporting structures, unspecified: Secondary | ICD-10-CM

## 2013-03-02 MED ORDER — HYDROCODONE-ACETAMINOPHEN 10-325 MG PO TABS: 1.0000 | ORAL_TABLET | Freq: Three times a day (TID) | ORAL | Status: DC | PRN

## 2013-03-02 MED ORDER — PENICILLIN V POTASSIUM 250 MG PO TABS: 250.0000 mg | ORAL_TABLET | Freq: Four times a day (QID) | ORAL | Status: DC

## 2013-03-02 MED ORDER — HYDROCODONE-ACETAMINOPHEN 5-325 MG PO TABS: 1.0000 | ORAL_TABLET | Freq: Four times a day (QID) | ORAL | Status: DC | PRN

## 2013-03-02 NOTE — Telephone Encounter (Signed)
Pt called because the paper prescription for Veetid was torn and the pharmacy will not accept it. Can we fax this over to her pharmacy. [facility]. jw

## 2013-03-02 NOTE — Telephone Encounter (Signed)
Rx sent electronically to [facility].[place], Rodena Medin

## 2013-03-02 NOTE — Progress Notes (Signed)
Patient ID: Nicole Barrera, female   DOB: [birth year], 52 y.o.   MRN: [id number]  Kevin Fenton, MD Phone: [phone]  Subjective:  Chief complaint-noted   52 year old female here with one week of left jaw/dental pain.  Describes that her pain is a variable 1- to 10 out of 10 pain that radiates from the left upper and lower gum area upward into her left temporal area, started about one week ago. She states it hurts worse with chewing or drinking any liquids. She states she has a history of poor dentition and a hard time getting into seeing dentists. She's had several teeth pulled. She denies fevers or chills She denies any swelling, warmth, or erythema the area. She denies nausea, vomiting, headache. She denies swelling of her neck. She has normal by mouth intake, and can't handle fluids easily the  ROS-pertinent  Past Medical History Patient Active Problem List   Diagnosis Date Noted  . Pain, dental 03/02/2013  . Trigger thumb of left hand 11/18/2012  . Left facial swelling 06/27/2012  . TOBACCO ABUSE 07/28/2008  . HYPERTENSION 11/19/2006  . SYSTOLIC MURMUR 11/19/2006    Medications- reviewed and updated Current Outpatient Prescriptions  Medication Sig Dispense Refill  . amLODipine (NORVASC) 5 MG tablet Take 1 tablet (5 mg total) by mouth daily.  90 tablet  3  . hydrochlorothiazide (HYDRODIURIL) 25 MG tablet TAKE ONE TABLET BY MOUTH ONCE DAILY  30 tablet  5  . HYDROcodone-acetaminophen (NORCO) 5-325 MG per tablet Take 1 tablet by mouth every 6 (six) hours as needed for moderate pain.  20 tablet  0  . meloxicam (MOBIC) 7.5 MG tablet Take 1 tablet (7.5 mg total) by mouth daily.  14 tablet  0  . penicillin v potassium (VEETID) 250 MG tablet Take 1 tablet (250 mg total) by mouth 4 (four) times daily.  56 tablet  0   No current facility-administered medications for this visit.    Objective: BP 163/85  Pulse 64  Temp(Src) 97.6 F (36.4 C) (Oral)  Ht 5\' 4"  (1.626 m)  Wt  132 lb (59.875 kg)  BMI 22.65 kg/m2 Gen: NAD, alert, cooperative with exam HEENT: NCAT, EOMI, PERRL  Poor dentition, only 1 molar on top and bottom on the L side, L lower molar appears loose and with apparent filling in middle, some point tenderness of gum near lower affected molar, none on top.  CV: RRR, good S1/S2 Resp: CTABL, no wheezes, non-labored Ext: No edema, warm Neuro: Alert and oriented, No gross deficits   Assessment/Plan:  Pain, dental Exam and history consistent with possible abscess tooth, although without swelling this could just be difficult. Pain. Discussed with her that dental help is available with the orange card, discussed with nursing team who is currently arranging dental followup. We'll treat for presumed abscess with penicillin V for 2 weeks., Also given small amount of Norco for pain. Discussed dose of anti-inflammatory, patient understands can take 800 mg of Advil every 8 hours, she declined additional prescription. Provided red flags, needs followup with dentist which is currently being arranged.      Orders Placed This Encounter  Procedures  . Ambulatory referral to Dentistry    Referral Priority:  Routine    Referral Type:  Consultation    Referral Reason:  Specialty Services Required    Requested Specialty:  Dental General Practice    Number of Visits Requested:  1    Meds ordered this encounter  Medications  . penicillin v potassium (VEETID)  250 MG tablet    Sig: Take 1 tablet (250 mg total) by mouth 4 (four) times daily.    Dispense:  56 tablet    Refill:  0  . DISCONTD: HYDROcodone-acetaminophen (NORCO) 10-325 MG per tablet    Sig: Take 1 tablet by mouth every 8 (eight) hours as needed.    Dispense:  20 tablet    Refill:  0  . HYDROcodone-acetaminophen (NORCO) 5-325 MG per tablet    Sig: Take 1 tablet by mouth every 6 (six) hours as needed for moderate pain.    Dispense:  20 tablet    Refill:  0

## 2013-03-02 NOTE — Assessment & Plan Note (Signed)
Exam and history consistent with possible abscess tooth, although without swelling this could just be difficult. Pain. Discussed with her that dental help is available with the orange card, discussed with nursing team who is currently arranging dental followup. We'll treat for presumed abscess with penicillin V for 2 weeks., Also given small amount of Norco for pain. Discussed dose of anti-inflammatory, patient understands can take 800 mg of Advil every 8 hours, she declined additional prescription. Provided red flags, needs followup with dentist which is currently being arranged.

## 2013-03-02 NOTE — Patient Instructions (Signed)
Abscessed Tooth  An abscessed tooth is an infection around your tooth. It may be caused by holes or damage to the tooth (cavity) or a dental disease. An abscessed tooth causes mild to very bad pain in and around the tooth. See your dentist right away if you have tooth or gum pain.  HOME CARE   Take your medicine as told. Finish it even if you start to feel better.   Do not drive after taking pain medicine.   Rinse your mouth (gargle) often with salt water ( teaspoon salt in 8 ounces of warm water).   Do not apply heat to the outside of your face.  GET HELP RIGHT AWAY IF:    You have a temperature by mouth above 102 F (38.9 C), not controlled by medicine.   You have chills and a very bad headache.   You have problems breathing or swallowing.   Your mouth will not open.   You develop puffiness (swelling) on the neck or around the eye.   Your pain is not helped by medicine.   Your pain is getting worse instead of better.  MAKE SURE YOU:    Understand these instructions.   Will watch your condition.   Will get help right away if you are not doing well or get worse.  Document Released: 09/03/2007 Document Revised: 06/09/2011 Document Reviewed: 06/25/2010  [employer] Patient Information 2014 [employer].

## 2013-03-08 ENCOUNTER — Ambulatory Visit: Payer: No Typology Code available for payment source | Admitting: Family Medicine

## 2013-06-28 ENCOUNTER — Ambulatory Visit: Payer: Self-pay

## 2013-07-06 ENCOUNTER — Ambulatory Visit: Payer: Self-pay

## 2013-07-12 ENCOUNTER — Ambulatory Visit: Payer: Self-pay

## 2013-08-10 ENCOUNTER — Emergency Department (HOSPITAL_COMMUNITY)
Admission: EM | Admit: 2013-08-10 | Discharge: 2013-08-10 | Discharge status: Absent without leave | Payer: Self-pay | Source: Home / Self Care

## 2013-08-11 ENCOUNTER — Emergency Department (INDEPENDENT_AMBULATORY_CARE_PROVIDER_SITE_OTHER)
Admission: EM | Admit: 2013-08-11 | Discharge: 2013-08-11 | Disposition: A | Discharge status: Home / Self Care | Payer: Self-pay | Source: Home / Self Care | Attending: Family Medicine | Admitting: Family Medicine

## 2013-08-11 ENCOUNTER — Encounter (HOSPITAL_COMMUNITY): Payer: Self-pay | Admitting: Emergency Medicine

## 2013-08-11 ENCOUNTER — Emergency Department (INDEPENDENT_AMBULATORY_CARE_PROVIDER_SITE_OTHER): Payer: Self-pay

## 2013-08-11 DIAGNOSIS — M7020 Olecranon bursitis, unspecified elbow: Secondary | ICD-10-CM

## 2013-08-11 DIAGNOSIS — M7022 Olecranon bursitis, left elbow: Secondary | ICD-10-CM

## 2013-08-11 MED ORDER — NAPROXEN 375 MG PO TABS: 375.0000 mg | ORAL_TABLET | Freq: Two times a day (BID) | ORAL | Status: DC

## 2013-08-11 NOTE — ED Provider Notes (Signed)
CSN: [id number]     Arrival date & time 08/11/13  0815 History   First MD Initiated Contact with Patient 08/11/13 0825     Chief Complaint  Patient presents with  . Elbow Problem   (Consider location/radiation/quality/duration/timing/severity/associated sxs/prior Treatment) Patient is a 53 y.o. female presenting with arm injury. The history is provided by the patient.  Arm Injury Location:  Elbow Time since incident:  2 days Injury: yes   Mechanism of injury comment:  Physical stress Elbow location:  L elbow Pain details:    Quality:  Aching and throbbing   Radiates to:  Does not radiate   Severity:  Moderate   Onset quality:  Sudden   Timing:  Constant   Progression:  Unchanged Chronicity:  New Handedness:  Right-handed Dislocation: no   Foreign body present:  No foreign bodies Prior injury to area:  No Relieved by:  Nothing Worsened by:  Movement Ineffective treatments:  None tried  Nicole Barrera is a 53 y.o. female who presents to the ED with left elbow pain. She states that she was lifting her grandson who weighs about 35 pounds out of the car sear and felt severe pain in her elbow. She didn't want to drop him so she continued to lift him and put him down. After that the pain continued.  Past Medical History  Diagnosis Date  . HTN (hypertension)   . Tobacco abuse   . 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 PEPTIC ULCER DISEASE 11/19/2006    Qualifier: Diagnosis of  By: Radene Ou MD, [place]     History reviewed. No pertinent past surgical history. History reviewed. No pertinent family history. History  Substance Use Topics  . Smoking status: Current Every Day Smoker -- 0.50 packs/day    Types: Cigarettes  . Smokeless tobacco: Not on file  . Alcohol Use: No   OB History   Grav Para Term Preterm Abortions TAB SAB Ect Mult Living                 Review of Systems Negative except as stated in HPI  Allergies   Lisinopril  Home Medications   Prior to Admission medications   Medication Sig Start Date End Date Taking? Authorizing Provider  amLODipine (NORVASC) 5 MG tablet Take 1 tablet (5 mg total) by mouth daily. 11/18/12   [name], DO  hydrochlorothiazide (HYDRODIURIL) 25 MG tablet TAKE ONE TABLET BY MOUTH ONCE DAILY 10/11/12   [name], DO  HYDROcodone-acetaminophen (NORCO) 5-325 MG per tablet Take 1 tablet by mouth every 6 (six) hours as needed for moderate pain. 03/02/13   Timmothy Euler, MD  meloxicam (MOBIC) 7.5 MG tablet Take 1 tablet (7.5 mg total) by mouth daily. 10/04/12   Rosana Hoes, MD  penicillin v potassium (VEETID) 250 MG tablet Take 1 tablet (250 mg total) by mouth 4 (four) times daily. 03/02/13   Timmothy Euler, MD   BP 169/85  Pulse 83  Temp(Src) 98.3 F (36.8 C) (Oral)  Resp 14  SpO2 100% Physical Exam  Nursing note and vitals reviewed. Constitutional: She is oriented to person, place, and time. She appears well-developed and well-nourished.  HENT:  Head: Normocephalic.  Eyes: EOM are normal.  Neck: Neck supple.  Cardiovascular: Normal rate.   Pulmonary/Chest: Effort normal.  Musculoskeletal:       Left elbow: She exhibits no deformity and no laceration. Decreased range of motion: due to  pain. Swelling: minimal. Tenderness found. Radial head tenderness noted.  Radial pulse strong, adequate circulation, good touch sensation. Good grips and equal. Pain over the radial head with extension of the arm.   Neurological: She is alert and oriented to person, place, and time. No cranial nerve deficit.  Skin: Skin is warm and dry.  Psychiatric: She has a normal mood and affect. Her behavior is normal.    ED Course  Procedures  Dg Elbow Complete Left  08/11/2013   CLINICAL DATA:  Posterior left elbow pain.  EXAM: LEFT ELBOW - COMPLETE 3+ VIEW  COMPARISON:  None.  FINDINGS: Four views of the left elbow reveal the bones to be adequately mineralized.  There is a small olecranon spur. There is no joint effusion. The radial head appears intact. The condylar and supracondylar regions of the humerus appear intact.  IMPRESSION: There is no evidence of an acute fracture or dislocation of the left elbow. There is an olecranon spur. Olecranon bursitis may be present and responsible for the patient's symptoms.   Electronically Signed   By: David  Martinique   On: 08/11/2013 09:42    MDM  53 y.o. female with left elbow pain x 2 days s/p injury. Brace applied for tendonitis, pain management and follow up with ortho if symptoms persist. Discussed with the patient clinical and x-ray findings and all questioned fully answered. She will return if any problems arise.     Medication List    TAKE these medications       naproxen 375 MG tablet  Commonly known as:  NAPROSYN  Take 1 tablet (375 mg total) by mouth 2 (two) times daily.      ASK your doctor about these medications       amLODipine 5 MG tablet  Commonly known as:  NORVASC  Take 1 tablet (5 mg total) by mouth daily.     hydrochlorothiazide 25 MG tablet  Commonly known as:  HYDRODIURIL  TAKE ONE TABLET BY MOUTH ONCE DAILY     HYDROcodone-acetaminophen 5-325 MG per tablet  Commonly known as:  NORCO  Take 1 tablet by mouth every 6 (six) hours as needed for moderate pain.     meloxicam 7.5 MG tablet  Commonly known as:  MOBIC  Take 1 tablet (7.5 mg total) by mouth daily.     penicillin v potassium 250 MG tablet  Commonly known as:  VEETID  Take 1 tablet (250 mg total) by mouth 4 (four) times daily.          Ashley Murrain, [place] 08/13/13 [phone]

## 2013-08-11 NOTE — ED Notes (Signed)
States she began to have trouble w left elbow 2 days ago when she lifted grandchild , and elbow began to hurt. Has limited ROM left elbow. Denies other source of trauma. States good sensation in hand/fingers

## 2013-08-12 ENCOUNTER — Ambulatory Visit: Payer: Self-pay | Admitting: Family Medicine

## 2013-08-14 NOTE — ED Provider Notes (Signed)
Medical screening examination/treatment/procedure(s) were performed by a resident physician or non-physician practitioner and as the supervising physician I was immediately available for consultation/collaboration.  Lynne Leader, MD    Gregor Hams, MD 08/14/13 [phone]

## 2013-10-17 ENCOUNTER — Other Ambulatory Visit: Payer: Self-pay | Admitting: Family Medicine

## 2013-10-17 DIAGNOSIS — I10 Essential (primary) hypertension: Secondary | ICD-10-CM

## 2013-11-10 ENCOUNTER — Ambulatory Visit: Payer: Self-pay | Admitting: Family Medicine

## 2013-11-10 ENCOUNTER — Encounter: Payer: Self-pay | Admitting: Family Medicine

## 2013-11-10 ENCOUNTER — Ambulatory Visit (INDEPENDENT_AMBULATORY_CARE_PROVIDER_SITE_OTHER): Payer: Self-pay | Admitting: Family Medicine

## 2013-11-10 VITALS — BP 112/80 | HR 130 | Temp 100.7°F | Ht 64.0 in | Wt 128.0 lb

## 2013-11-10 DIAGNOSIS — I10 Essential (primary) hypertension: Secondary | ICD-10-CM

## 2013-11-10 DIAGNOSIS — J209 Acute bronchitis, unspecified: Secondary | ICD-10-CM

## 2013-11-10 MED ORDER — AZITHROMYCIN 250 MG PO TABS: ORAL_TABLET | ORAL | Status: DC

## 2013-11-10 NOTE — Progress Notes (Signed)
   Subjective:    Patient ID: Nicole Barrera, female    DOB: 1960-06-10, 53 y.o.   MRN: [id number]  Seen for Same day visit for   CC: fevers  She reports subjective fevers and chills beginning Saturday associated with headache, sore throat, coughing, intermittent, sharp chest pain, and some shortness of breath.  She is a 30 year smoking history but quit Saturday. Denies previous COPD diagnosis or previous PNA. Her chest pain is b/l, sharp and only when she coughs or takes a deep breath. Her cough is nonproductive. She also endorses dizziness and falling twice since Saturday but denies LOC or hitting her head. She stopped taking her BP medication yesterday and has not fallen since. She denies previous MI or HF. Denies any orthopnea, LE swelling or PND.   Review of Systems   See HPI for ROS. Objective:  BP 112/80  Pulse 130  Temp(Src) 100.7 F (38.2 C)  Ht 5\' 4"  (1.626 m)  Wt 128 lb (58.06 kg)  BMI 21.96 kg/m2  SpO2 94%  General: NAD Cardiac: Tachycardia, normal heart sounds, no murmurs. 2+ radial pulses bilaterally; Cap refill ~ 2 sec Respiratory: Decreased breath sounds b/l without wheezing, rhonchi or rales; normal effort; Pulse ox 94% Abdomen: soft, nontender, nondistended Extremities: no edema or cyanosis. WWP. Skin: warm and dry, no rashes noted Neuro: alert and oriented, no focal deficits    Assessment & Plan:  See Problem List Documentation

## 2013-11-10 NOTE — Assessment & Plan Note (Signed)
Pertinent S&O  Fevers, Cough, Malaise   ~ 30 year smoking Hx  Tachycardia, Hypoxia (pulse ox 94%) Assessment  Likely Bronchitis vs COPD exacerbation vs PNA Plan  Zpack; Encourage hydration  Consdier Chest xray if not improving  Recommended PCP apt in 1 month and PFT / smoking cessation counseling

## 2013-11-10 NOTE — Patient Instructions (Signed)
It was great seeing you today.   1. You have acute bronchitis and should take Azithromycin for the next 5 days: Take 2 pills on day 1 then 1 pill for the next 4 days 2. Do not take your Blood pressure medication for the next 2 days 3. Remember to drink plenty of water  Next Appointment  Please make an appointment with Dr Parks Ranger in 1 month   If you have any questions or concerns before then, please call the clinic at [phone].  Take Care,   Dr Phill Myron

## 2013-11-11 LAB — CBC
HCT: 27.9 % — ABNORMAL LOW (ref 36.0–46.0)
Hemoglobin: 9.3 g/dL — ABNORMAL LOW (ref 12.0–15.0)
MCH: 26.4 pg (ref 26.0–34.0)
MCHC: 33.3 g/dL (ref 30.0–36.0)
MCV: 79.3 fL (ref 78.0–100.0)
PLATELETS: 382 10*3/uL (ref 150–400)
RBC: 3.52 MIL/uL — AB (ref 3.87–5.11)
RDW: 17.1 % — AB (ref 11.5–15.5)
WBC: 8.7 10*3/uL (ref 4.0–10.5)

## 2013-11-11 LAB — LIPID PANEL
CHOL/HDL RATIO: 3.4 ratio
Cholesterol: 94 mg/dL (ref 0–200)
HDL: 28 mg/dL — AB (ref >39)
LDL CALC: 54 mg/dL (ref 0–99)
TRIGLYCERIDES: 62 mg/dL (ref <150)
VLDL: 12 mg/dL (ref 0–40)

## 2013-11-11 LAB — BASIC METABOLIC PANEL
BUN: 12 mg/dL (ref 6–23)
CHLORIDE: 99 meq/L (ref 96–112)
CO2: 26 mEq/L (ref 19–32)
CREATININE: 0.97 mg/dL (ref 0.50–1.10)
Calcium: 8.2 mg/dL — ABNORMAL LOW (ref 8.4–10.5)
Glucose, Bld: 126 mg/dL — ABNORMAL HIGH (ref 70–99)
Potassium: 3.2 mEq/L — ABNORMAL LOW (ref 3.5–5.3)
SODIUM: 134 meq/L — AB (ref 135–145)

## 2014-01-11 ENCOUNTER — Ambulatory Visit: Payer: Self-pay | Admitting: Family Medicine

## 2014-05-19 ENCOUNTER — Encounter: Payer: Self-pay | Admitting: Family Medicine

## 2014-06-09 ENCOUNTER — Emergency Department (HOSPITAL_COMMUNITY)
Admission: EM | Admit: 2014-06-09 | Discharge: 2014-06-09 | Disposition: A | Discharge status: Home / Self Care | Payer: Self-pay | Attending: Emergency Medicine | Admitting: Emergency Medicine

## 2014-06-09 ENCOUNTER — Emergency Department (HOSPITAL_COMMUNITY): Payer: Self-pay

## 2014-06-09 ENCOUNTER — Encounter (HOSPITAL_COMMUNITY): Payer: Self-pay | Admitting: Family Medicine

## 2014-06-09 DIAGNOSIS — Z792 Long term (current) use of antibiotics: Secondary | ICD-10-CM | POA: Insufficient documentation

## 2014-06-09 DIAGNOSIS — Z862 Personal history of diseases of the blood and blood-forming organs and certain disorders involving the immune mechanism: Secondary | ICD-10-CM | POA: Insufficient documentation

## 2014-06-09 DIAGNOSIS — I10 Essential (primary) hypertension: Secondary | ICD-10-CM | POA: Insufficient documentation

## 2014-06-09 DIAGNOSIS — Z720 Tobacco use: Secondary | ICD-10-CM | POA: Insufficient documentation

## 2014-06-09 DIAGNOSIS — R059 Cough, unspecified: Secondary | ICD-10-CM

## 2014-06-09 DIAGNOSIS — Z791 Long term (current) use of non-steroidal anti-inflammatories (NSAID): Secondary | ICD-10-CM | POA: Insufficient documentation

## 2014-06-09 DIAGNOSIS — Z79899 Other long term (current) drug therapy: Secondary | ICD-10-CM | POA: Insufficient documentation

## 2014-06-09 DIAGNOSIS — R112 Nausea with vomiting, unspecified: Secondary | ICD-10-CM | POA: Insufficient documentation

## 2014-06-09 DIAGNOSIS — R531 Weakness: Secondary | ICD-10-CM | POA: Insufficient documentation

## 2014-06-09 DIAGNOSIS — R42 Dizziness and giddiness: Secondary | ICD-10-CM | POA: Insufficient documentation

## 2014-06-09 DIAGNOSIS — R197 Diarrhea, unspecified: Secondary | ICD-10-CM | POA: Insufficient documentation

## 2014-06-09 DIAGNOSIS — Z8711 Personal history of peptic ulcer disease: Secondary | ICD-10-CM | POA: Insufficient documentation

## 2014-06-09 DIAGNOSIS — R0981 Nasal congestion: Secondary | ICD-10-CM | POA: Insufficient documentation

## 2014-06-09 DIAGNOSIS — R05 Cough: Secondary | ICD-10-CM | POA: Insufficient documentation

## 2014-06-09 DIAGNOSIS — R5383 Other fatigue: Secondary | ICD-10-CM | POA: Insufficient documentation

## 2014-06-09 LAB — COMPREHENSIVE METABOLIC PANEL
ALBUMIN: 3.4 g/dL — AB (ref 3.5–5.2)
ALT: 17 U/L (ref 0–35)
ANION GAP: 5 (ref 5–15)
AST: 29 U/L (ref 0–37)
Alkaline Phosphatase: 73 U/L (ref 39–117)
BILIRUBIN TOTAL: 0.4 mg/dL (ref 0.3–1.2)
BUN: 11 mg/dL (ref 6–23)
CHLORIDE: 100 mmol/L (ref 96–112)
CO2: 27 mmol/L (ref 19–32)
CREATININE: 0.94 mg/dL (ref 0.50–1.10)
Calcium: 8.8 mg/dL (ref 8.4–10.5)
GFR calc Af Amer: 79 mL/min — ABNORMAL LOW (ref >90)
GFR calc non Af Amer: 68 mL/min — ABNORMAL LOW (ref >90)
GLUCOSE: 105 mg/dL — AB (ref 70–99)
Potassium: 3.4 mmol/L — ABNORMAL LOW (ref 3.5–5.1)
Sodium: 132 mmol/L — ABNORMAL LOW (ref 135–145)
TOTAL PROTEIN: 9.4 g/dL — AB (ref 6.0–8.3)

## 2014-06-09 LAB — CBC WITH DIFFERENTIAL/PLATELET
BASOS ABS: 0 10*3/uL (ref 0.0–0.1)
BASOS PCT: 0 % (ref 0–1)
EOS PCT: 0 % (ref 0–5)
Eosinophils Absolute: 0 10*3/uL (ref 0.0–0.7)
HCT: 28.4 % — ABNORMAL LOW (ref 36.0–46.0)
Hemoglobin: 9.1 g/dL — ABNORMAL LOW (ref 12.0–15.0)
Lymphocytes Relative: 20 % (ref 12–46)
Lymphs Abs: 0.9 10*3/uL (ref 0.7–4.0)
MCH: 26.1 pg (ref 26.0–34.0)
MCHC: 32 g/dL (ref 30.0–36.0)
MCV: 81.4 fL (ref 78.0–100.0)
MONO ABS: 0.2 10*3/uL (ref 0.1–1.0)
MONOS PCT: 5 % (ref 3–12)
Neutro Abs: 3.4 10*3/uL (ref 1.7–7.7)
Neutrophils Relative %: 75 % (ref 43–77)
Platelets: 221 10*3/uL (ref 150–400)
RBC: 3.49 MIL/uL — AB (ref 3.87–5.11)
RDW: 16.5 % — AB (ref 11.5–15.5)
WBC: 4.5 10*3/uL (ref 4.0–10.5)

## 2014-06-09 LAB — I-STAT CG4 LACTIC ACID, ED: Lactic Acid, Venous: 0.64 mmol/L (ref 0.5–2.0)

## 2014-06-09 LAB — TROPONIN I: Troponin I: <0.03 ng/mL (ref <0.031)

## 2014-06-09 MED ORDER — PROMETHAZINE HCL 25 MG PO TABS: 25.0000 mg | ORAL_TABLET | Freq: Four times a day (QID) | ORAL | Status: DC | PRN

## 2014-06-09 MED ORDER — SODIUM CHLORIDE 0.9 % IV BOLUS (SEPSIS)
1000.0000 mL | Freq: Once | INTRAVENOUS | Status: AC
  Administered 2014-06-09: 1000 mL via INTRAVENOUS

## 2014-06-09 MED ORDER — ONDANSETRON HCL 4 MG/2ML IJ SOLN
4.0000 mg | Freq: Once | INTRAMUSCULAR | Status: AC
  Administered 2014-06-09: 4 mg via INTRAVENOUS
  Filled 2014-06-09: qty 2

## 2014-06-09 MED ORDER — DIPHENOXYLATE-ATROPINE 2.5-0.025 MG PO TABS: 1.0000 | ORAL_TABLET | Freq: Four times a day (QID) | ORAL | Status: DC | PRN

## 2014-06-09 MED ORDER — HYDROCODONE-HOMATROPINE 5-1.5 MG/5ML PO SYRP: 5.0000 mL | ORAL_SOLUTION | Freq: Four times a day (QID) | ORAL | Status: DC | PRN

## 2014-06-09 MED ORDER — POTASSIUM CHLORIDE CRYS ER 20 MEQ PO TBCR
40.0000 meq | EXTENDED_RELEASE_TABLET | Freq: Once | ORAL | Status: AC
  Administered 2014-06-09: 40 meq via ORAL
  Filled 2014-06-09: qty 2

## 2014-06-09 NOTE — ED Provider Notes (Signed)
Vision resting comfortably. Asymptomatic. Had near syncopal event today while sitting on the toilet while having diarrhea. On exam she is alert no distress. Patient's had a cough over the past week which is improving with time. She's felt nauseated, without vomiting. Not lightheaded on standing. Gait is normal. Speech is clear. She feels ready to go home. Near syncopal event felt to be vasovagal in etiology. She'll be given potassium chloride 40 mg prior to discharge. Dr. Rosana Berger has written prescriptions for Phenergan, Lomotil and Hycodan cough syrup. Anemia is chorin and unchasnged from 7 months ago Results for orders placed or performed during the hospital encounter of 06/09/14  Comprehensive metabolic panel  Result Value Ref Range   Sodium 132 (L) 135 - 145 mmol/L   Potassium 3.4 (L) 3.5 - 5.1 mmol/L   Chloride 100 96 - 112 mmol/L   CO2 27 19 - 32 mmol/L   Glucose, Bld 105 (H) 70 - 99 mg/dL   BUN 11 6 - 23 mg/dL   Creatinine, Ser 0.94 0.50 - 1.10 mg/dL   Calcium 8.8 8.4 - 10.5 mg/dL   Total Protein 9.4 (H) 6.0 - 8.3 g/dL   Albumin 3.4 (L) 3.5 - 5.2 g/dL   AST 29 0 - 37 U/L   ALT 17 0 - 35 U/L   Alkaline Phosphatase 73 39 - 117 U/L   Total Bilirubin 0.4 0.3 - 1.2 mg/dL   GFR calc non Af Amer 68 (L) >90 mL/min   GFR calc Af Amer 79 (L) >90 mL/min   Anion gap 5 5 - 15  Troponin I  Result Value Ref Range   Troponin I <0.03 <0.031 ng/mL  CBC with Differential  Result Value Ref Range   WBC 4.5 4.0 - 10.5 K/uL   RBC 3.49 (L) 3.87 - 5.11 MIL/uL   Hemoglobin 9.1 (L) 12.0 - 15.0 g/dL   HCT 28.4 (L) 36.0 - 46.0 %   MCV 81.4 78.0 - 100.0 fL   MCH 26.1 26.0 - 34.0 pg   MCHC 32.0 30.0 - 36.0 g/dL   RDW 16.5 (H) 11.5 - 15.5 %   Platelets 221 150 - 400 K/uL   Neutrophils Relative % 75 43 - 77 %   Neutro Abs 3.4 1.7 - 7.7 K/uL   Lymphocytes Relative 20 12 - 46 %   Lymphs Abs 0.9 0.7 - 4.0 K/uL   Monocytes Relative 5 3 - 12 %   Monocytes Absolute 0.2 0.1 - 1.0 K/uL   Eosinophils Relative 0  0 - 5 %   Eosinophils Absolute 0.0 0.0 - 0.7 K/uL   Basophils Relative 0 0 - 1 %   Basophils Absolute 0.0 0.0 - 0.1 K/uL  I-Stat CG4 Lactic Acid, ED  Result Value Ref Range   Lactic Acid, Venous 0.64 0.5 - 2.0 mmol/L   Dg Chest 2 View  06/09/2014   CLINICAL DATA:  Cough, 4 days duration. Dizziness. Decreased appetite.  EXAM: CHEST  2 VIEW  COMPARISON:  None.  FINDINGS: Heart size is normal. Mediastinal shadows are normal. There is mild central bronchial thickening but no infiltrate, collapse or effusion. Bony structures are normal.  IMPRESSION: No infiltrate or collapse.  Mild central bronchial thickening.   Electronically Signed   By: Nelson Chimes M.D.   On: 06/09/2014 16:38     Orlie Dakin, MD 06/09/14 1906

## 2014-06-09 NOTE — Discharge Instructions (Signed)
Near-Syncope Near-syncope (commonly known as near fainting) is sudden weakness, dizziness, or feeling like you might pass out. This can happen when getting up or while standing for a long time. It is caused by a sudden decrease in blood flow to the brain, which can occur for various reasons. Most of the reasons are not serious.  HOME CARE Watch your condition for any changes.  Have someone stay with you until you feel stable.  If you feel like you are going to pass out:  Lie down right away.  Prop your feet up if you can.  Breathe deeply and steadily.  Move only when the feeling has gone away. Most of the time, this feeling lasts only a few minutes. You may feel tired for several hours.  Drink enough fluids to keep your pee (urine) clear or pale yellow.  If you are taking blood pressure or heart medicine, stand up slowly.  Follow up with your doctor as told. GET HELP RIGHT AWAY IF:   You have a severe headache.  You have unusual pain in the chest, belly (abdomen), or back.  You have bleeding from the mouth or butt (rectum), or you have black or tarry poop (stool).  You feel your heart beat differently than normal, or you have a very fast pulse.  You pass out, or you twitch and shake when you pass out.  You pass out when sitting or lying down.  You feel confused.  You have trouble walking.  You are weak.  You have vision problems. MAKE SURE YOU:   Understand these instructions.  Will watch your condition.  Will get help right away if you are not doing well or get worse. Document Released: 09/03/2007 Document Revised: 03/22/2013 Document Reviewed: 08/20/2012 [facility] Patient Information 2015 [place], [place]. This information is not intended to replace advice given to you by your health care provider. Make sure you discuss any questions you have with your health care provider.

## 2014-06-09 NOTE — ED Notes (Signed)
Pt is in stable condition upon d/c and is escorted from ED by this RN.

## 2014-06-09 NOTE — ED Notes (Signed)
Pt presents from home via [facility] with c/o near syncopal episode that occurred today around 1330.   Pt's daughter notes that she spoke with her on the phone at 1339 and the patient had some slurred speech at that time but speech is clear at this time. Pt reports is unsure if she remembers entire episode but had incontinence of bowel (diarrhea noted by EMS) and vomited on herself prior to EMS arrival.  Upon EMS Pt has been having allergy/cold symptoms x2-3 days and today felt worse. PT is A&O and in NAD.

## 2014-06-09 NOTE — ED Provider Notes (Signed)
CSN: [id number]     Arrival date & time 06/09/14  1425 History   First MD Initiated Contact with Patient 06/09/14 1429     Chief Complaint  Patient presents with  . Near Syncope     (Consider location/radiation/quality/duration/timing/severity/associated sxs/prior Treatment) HPI Comments: Patient presents to the ER for evaluation of cold symptoms and near syncope. Patient has been sick for nearly one week. She started with sinus congestion. 3 days ago she started having cough and chest congestion. She then developed nausea, vomiting and diarrhea. Today she has been feeling weak and like she is going to pass out. She is not experiencing any abdominal pain. She has not noticed any fever.  Patient is a 54 y.o. female presenting with near-syncope.  Near Syncope    Past Medical History  Diagnosis Date  . HTN (hypertension)   . Tobacco abuse   . 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 PEPTIC ULCER DISEASE 11/19/2006    Qualifier: Diagnosis of  By: Radene Ou MD, [place]     No past surgical history on file. No family history on file. History  Substance Use Topics  . Smoking status: Current Every Day Smoker -- 0.50 packs/day    Types: Cigarettes  . Smokeless tobacco: Not on file  . Alcohol Use: No   OB History    No data available     Review of Systems  Constitutional: Positive for fatigue.  HENT: Positive for congestion.   Respiratory: Positive for cough.   Cardiovascular: Positive for near-syncope.  Gastrointestinal: Positive for vomiting and diarrhea.  Neurological: Positive for dizziness and weakness.  All other systems reviewed and are negative.     Allergies  Lisinopril  Home Medications   Prior to Admission medications   Medication Sig Start Date End Date Taking? Authorizing Provider  amLODipine (NORVASC) 5 MG tablet Take 1 tablet (5 mg total) by mouth daily. 11/18/12   Olin Hauser, DO  azithromycin  (ZITHROMAX) 250 MG tablet Take 2 pills on day 1 then 1 pill for the next 4 days 11/10/13   Olam Idler, MD  diphenoxylate-atropine (LOMOTIL) 2.5-0.025 MG per tablet Take 1 tablet by mouth 4 (four) times daily as needed for diarrhea or loose stools. 06/09/14   Orpah Greek, MD  hydrochlorothiazide (HYDRODIURIL) 25 MG tablet TAKE ONE TABLET BY MOUTH ONCE DAILY 10/17/13   Olin Hauser, DO  HYDROcodone-acetaminophen (NORCO) 5-325 MG per tablet Take 1 tablet by mouth every 6 (six) hours as needed for moderate pain. 03/02/13   Timmothy Euler, MD  HYDROcodone-homatropine [facility]) 5-1.5 MG/5ML syrup Take 5 mLs by mouth every 6 (six) hours as needed for cough. 06/09/14   Orpah Greek, MD  meloxicam (MOBIC) 7.5 MG tablet Take 1 tablet (7.5 mg total) by mouth daily. 10/04/12   Rosana Hoes, MD  naproxen (NAPROSYN) 375 MG tablet Take 1 tablet (375 mg total) by mouth 2 (two) times daily. 08/11/13   Hope Bunnie Pion, NP  penicillin v potassium (VEETID) 250 MG tablet Take 1 tablet (250 mg total) by mouth 4 (four) times daily. 03/02/13   Timmothy Euler, MD  promethazine (PHENERGAN) 25 MG tablet Take 1 tablet (25 mg total) by mouth every 6 (six) hours as needed for nausea or vomiting. 06/09/14   Orpah Greek, MD   BP 154/92 mmHg  Pulse 77  Temp(Src) 98.4 F (36.9 C) (Oral)  Resp 16  SpO2 96%  Physical Exam  Constitutional: She is oriented to person, place, and time. She appears well-developed and well-nourished. No distress.  HENT:  Head: Normocephalic and atraumatic.  Right Ear: Hearing normal.  Left Ear: Hearing normal.  Nose: Nose normal.  Mouth/Throat: Oropharynx is clear and moist and mucous membranes are normal.  Eyes: Conjunctivae and EOM are normal. Pupils are equal, round, and reactive to light.  Neck: Normal range of motion. Neck supple.  Cardiovascular: Regular rhythm, S1 normal and S2 normal.  Exam reveals no gallop and no friction rub.   No murmur  heard. Pulmonary/Chest: Effort normal and breath sounds normal. No respiratory distress. She exhibits no tenderness.  Abdominal: Soft. Normal appearance and bowel sounds are normal. There is no hepatosplenomegaly. There is no tenderness. There is no rebound, no guarding, no tenderness at McBurney's point and negative Murphy's sign. No hernia.  Musculoskeletal: Normal range of motion.  Neurological: She is alert and oriented to person, place, and time. She has normal strength. No cranial nerve deficit or sensory deficit. Coordination normal. GCS eye subscore is 4. GCS verbal subscore is 5. GCS motor subscore is 6.  Extraocular muscle movement: normal No visual field cut Pupils: equal and reactive both direct and consensual response is normal No nystagmus present    Sensory function is intact to light touch, pinprick Proprioception intact  Grip strength 5/5 symmetric in upper extremities No pronator drift Normal finger to nose bilaterally  Lower extremity strength 5/5 against gravity Normal heel to shin bilaterally  Gait: normal   Skin: Skin is warm, dry and intact. No rash noted. No cyanosis.  Psychiatric: She has a normal mood and affect. Her speech is normal and behavior is normal. Thought content normal.  Nursing note and vitals reviewed.   ED Course  Procedures (including critical care time) Labs Review Labs Reviewed  COMPREHENSIVE METABOLIC PANEL - Abnormal; Notable for the following:    Sodium 132 (*)    Potassium 3.4 (*)    Glucose, Bld 105 (*)    Total Protein 9.4 (*)    Albumin 3.4 (*)    GFR calc non Af Amer 68 (*)    GFR calc Af Amer 79 (*)    All other components within normal limits  CBC WITH DIFFERENTIAL/PLATELET - Abnormal; Notable for the following:    RBC 3.49 (*)    Hemoglobin 9.1 (*)    HCT 28.4 (*)    RDW 16.5 (*)    All other components within normal limits  TROPONIN I  CBC WITH DIFFERENTIAL/PLATELET  I-STAT CG4 LACTIC ACID, ED    Imaging  Review No results found.   EKG Interpretation   Date/Time:  Friday June 09 2014 16:01:41 EST Ventricular Rate:  89 PR Interval:  189 QRS Duration: 85 QT Interval:  380 QTC Calculation: 462 R Axis:   71 Text Interpretation:  Sinus rhythm Left ventricular hypertrophy No  significant change since last tracing Confirmed by JACUBOWITZ  MD, SAM  [phone]) on 06/09/2014 7:04:29 PM      MDM   Final diagnoses:  Cough  Non-intractable vomiting with nausea, vomiting of unspecified type  Diarrhea    Patient presents to the ER for evaluation of multiple symptoms. Symptoms are consistent with viral process. Evaluation was unremarkable here in the ER. Patient treated symptomatically.    Orpah Greek, MD 06/13/14 [phone]

## 2014-07-24 ENCOUNTER — Encounter: Payer: Self-pay | Admitting: Family Medicine

## 2014-07-24 ENCOUNTER — Ambulatory Visit (INDEPENDENT_AMBULATORY_CARE_PROVIDER_SITE_OTHER): Payer: Self-pay | Admitting: Family Medicine

## 2014-07-24 VITALS — BP 175/100 | HR 81 | Temp 98.5°F | Ht 64.0 in | Wt 131.1 lb

## 2014-07-24 DIAGNOSIS — Z720 Tobacco use: Secondary | ICD-10-CM

## 2014-07-24 DIAGNOSIS — F17200 Nicotine dependence, unspecified, uncomplicated: Secondary | ICD-10-CM

## 2014-07-24 DIAGNOSIS — I10 Essential (primary) hypertension: Secondary | ICD-10-CM

## 2014-07-24 DIAGNOSIS — M654 Radial styloid tenosynovitis [de Quervain]: Secondary | ICD-10-CM

## 2014-07-24 MED ORDER — HYDROCHLOROTHIAZIDE 25 MG PO TABS: 25.0000 mg | ORAL_TABLET | Freq: Every day | ORAL | Status: DC

## 2014-07-24 MED ORDER — AMLODIPINE BESYLATE 5 MG PO TABS: 5.0000 mg | ORAL_TABLET | Freq: Every day | ORAL | Status: DC

## 2014-07-24 NOTE — Assessment & Plan Note (Signed)
Consistent with mild R-wrist De Quervain's tenosynovitis following R-wrist strain 2 weeks ago. Mild edema, otherwise no complications, no infection, or neuro deficits. Improves with Advil and pressure. No high impact trauma, unlikely fracture, with some pain over anatomical snuffbox similar to other pain.  Plan: 1. Start OTC Advil 400mg  q 6 hr x 2 weeks then PRN 2. Recommend Right wrist Thumb Spica splint during activity, especially pushing wheelchairs, and overnight - 2-4 weeks then PRN 3. Conservative therapy, may try heat prn or ice if swollen, relative rest and avoid heavy lifting 4. RTC re-evaluation 2 weeks

## 2014-07-24 NOTE — Assessment & Plan Note (Signed)
Active smoker, previously failed smoking cessation recommendations - Not ready to quit. Discussed impact on uncontrolled HTN and provided smoking cessation

## 2014-07-24 NOTE — Patient Instructions (Addendum)
Dear Nicole Barrera, Thank you for coming in to clinic today. It was good to see you again!  1. For your Wrist Pain - It sounds you have Tendinitis - or inflammed muscle tendons after possibly straining your wrist. I do not think anything is broken. Recommend taking Advil 400mg  (2 of the 200mg  tablets with food and a glass of water) every 6 hours for the next 2 weeks. Also please purchase a Right Thumb Spica Splint (to support your thumb and wrist muscle) - wear while active, pushing wheelchair, and at night. May use ice or heat as needed, avoid strenuous lifting. 2. For your Blood Pressure - Elevated today up to 200 / 100, re-checked with improvement - Very important to take your BP medicine every day (same time in morning) - take HCTZ 25mg  daily and Amlodipine 5mg  daily (sent this new rx in, please pick up today at pharmacy) - Check BP at home 1 to 2 times daily (different times, write down all readings) - IF BP < 160 / 90, then you do not need to come in later this week for nurse visit for BP check. If higher, then please schedule this apt  Given sheet on mammogram - call [facility] [phone]) to schedule Mammogram)  Discuss other screening at next physical in 1-2 weeks.  If you develop Chest pain or tightness, Shortness of Breath, Headache (not improving with medicine), loss of vision, signs of stroke with muscle weakness, slurred speech, or passing out, or other concerning symptoms, please call [phone] immediately to go to ED for evaluation, high risk if BP remains high.  Please schedule a follow-up appointment with Dr. Parks Ranger in 1 to 2 weeks for Physical, pap smear, and BP follow-up  If you have any other questions or concerns, please feel free to call the clinic to contact me. You may also schedule an earlier appointment if necessary.  However, if your symptoms get significantly worse, please go to the Emergency Department to seek immediate medical  attention.  [name], [place]

## 2014-07-24 NOTE — Assessment & Plan Note (Signed)
Significantly elevated BP, >200/100 consistent with HTN urgency, asymptomatic, improved after manual re-check < 170/100. Uncontrolled HTN previously with medical non-compliance (concerned about medication side-effects, note allergy to Lisinopril), continues to smoke with chronic tobacco abuse. - Uncomplicated, previously with improved BP, recently 150/90 in ED 05/2014  Plan: 1. Continue HCTZ 25mg  daily - sent refill 2. Re-ordered Amlodipine 5mg  daily, #30 - pick up today and start, will likely need to titrate up to 10mg  daily 3. Not due for routine labs - last BMET checked 06/09/14 with stable Cr 0.94. Considered repeat today however BP improved 4. Smoking cessation counseling 5. Check and log BP at home (family members RN), BP >160/90 need to return to [facility] within 48 hours for Nurse Visit BP re-check. If BP remains elevated recommend increase Amlodipine to 10mg  daily 6. Strict return precautions to go to ED if develops symptoms and BP remains elevated. RTC otherwise 1-2 weeks for OV BP follow-up and physical / preventative visit as planned

## 2014-07-24 NOTE — Progress Notes (Signed)
   Subjective:    Patient ID: Nicole Barrera, female    DOB: 09/03/1960, 54 y.o.   MRN: [id number]  HPI  RIGHT WRIST PAIN: - Reports new complaint with Right wrist pain for past 2 weeks, initial injury occurred while pushing her grandmother in wheelchair, describes she believes that she "twisted" her right wrist when the wheelchair had the breaks on and she wasn't able to move it. Tried a few Advil with mild relief, pain worse with lifting or straining mostly over right thumb, improves with direct pressure (tried left wrist splint helped, but didn't fit well) - Overall, today reports gradual improvement, but continued symptoms with mild edema and pain - Denies any bruising, numbness, tingling, weakness, redness, other painful joints  HTN URGENCY / CHRONIC HTN: Reports - not taking Amlodipine 5mg  (has never started, rx about 1 year ago). Admits to missing HCTZ 25mg  on most days Current Meds - HCTZ 25 mg daily   Reports poor compliance, but took meds today Lifestyle - no regular exercise Denies CP, dyspnea, HA, edema, dizziness / lightheadedness, vision loss, focal weakness  HM: - Requests Mammogram screening (no prior mammo), Family hx Breast Cancer (mother, age 80 now with metastatic disease to bone) - Due for colonoscopy, pap smear, TDap, will return for preventative visit to discuss  I have reviewed and updated the following as appropriate: allergies and current medications  Social Hx: - Currently smoking, about 15 cigs daily (recently increased), 25 years  Review of Systems  See above HPI    Objective:   Physical Exam  BP 175/100 mmHg  Pulse 81  Temp(Src) 98.5 F (36.9 C) (Oral)  Ht 5\' 4"  (1.626 m)  Wt 131 lb 1.6 oz (59.467 kg)  BMI 22.49 kg/m2  Re-check manual BP, both arms improved after >15 min resting, Left arm 175/100, initial BP 202/110  Gen - well-appearing, pleasant and cooperative, NAD HEENT - NCAT, PERRL, EOMI, MMM Neck - supple, non-tender Heart - RRR, no  murmurs heard MSK / Ext - Right Wrist - tenderness to palpation over radial aspect of R-wrist and R-thumb, mild localized edema without erythema or warmth, +Finklestein's test (Right), negative Tinel's sign, R-wrist ext / flex normal active ROM, muscle str 5/5 bilaterally, peripheral pulses intact +2 b/l Skin - warm, dry, no rashes Neuro - awake, alert, oriented, grossly non-focal with CN-II-XII intact, intact distal sensation to light touch, gait normal     Assessment & Plan:   See specific A&P problem list for details.

## 2014-07-26 ENCOUNTER — Ambulatory Visit (INDEPENDENT_AMBULATORY_CARE_PROVIDER_SITE_OTHER): Payer: Self-pay | Admitting: *Deleted

## 2014-07-26 VITALS — BP 170/100 | HR 95

## 2014-07-26 DIAGNOSIS — I10 Essential (primary) hypertension: Secondary | ICD-10-CM

## 2014-07-26 DIAGNOSIS — Z136 Encounter for screening for cardiovascular disorders: Secondary | ICD-10-CM

## 2014-07-26 DIAGNOSIS — Z0130 Encounter for examination of blood pressure without abnormal findings: Secondary | ICD-10-CM

## 2014-07-26 NOTE — Progress Notes (Signed)
   Pt in nurse clinic for blood pressure check.  BP 190/100 left arm manually, 170/100 right arm manually, heart rate 95.  Pt home BP reading 190/102.  Per verbal order by Dr. Parks Ranger; increase Norvasc to 10 mg daily, continue HCTZ 25 mg, if BP is over 200/120 pt to go to ED, appt with PCP 07/27/14 at 1:45 PM.  Pt given instructions and stated understanding.  Derl Barrow, RN

## 2014-07-27 ENCOUNTER — Encounter: Payer: Self-pay | Admitting: Family Medicine

## 2014-07-27 ENCOUNTER — Ambulatory Visit (INDEPENDENT_AMBULATORY_CARE_PROVIDER_SITE_OTHER): Payer: Self-pay | Admitting: Family Medicine

## 2014-07-27 VITALS — BP 158/92 | HR 78 | Temp 98.2°F | Ht 64.0 in | Wt 128.8 lb

## 2014-07-27 DIAGNOSIS — I10 Essential (primary) hypertension: Secondary | ICD-10-CM

## 2014-07-27 DIAGNOSIS — Z720 Tobacco use: Secondary | ICD-10-CM

## 2014-07-27 DIAGNOSIS — F17200 Nicotine dependence, unspecified, uncomplicated: Secondary | ICD-10-CM

## 2014-07-27 MED ORDER — AMLODIPINE BESYLATE 10 MG PO TABS: 10.0000 mg | ORAL_TABLET | Freq: Every day | ORAL | Status: DC

## 2014-07-27 NOTE — Assessment & Plan Note (Signed)
Reduced smoking significantly within 48 hours, 15 to 5 cigs daily. Seems motivated and ready to quit. Also reduced caffeine, caution with quick cessation of both.  Plan: 1. Smoking cessation counseling provided 2. Encouraged pt to set quit date, she decided on 08/06/14 (Mother's Day) plans to quit for Mother who is battling metastatic breast cancer 3. Offered NRT, medications, close follow-up as needed and Pharmacy Clinic if needed

## 2014-07-27 NOTE — Patient Instructions (Signed)
Dear Nicole Barrera, Thank you for coming in to clinic today. It was good to see you again!  1. For your Wrist Pain - It sounds you have Tendinitis - or inflammed muscle tendons after possibly straining your wrist. I do not think anything is broken. Recommend taking Advil 400mg  (2 of the 200mg  tablets with food and a glass of water) every 6 hours for the next 2 weeks. Also please purchase a Right Thumb Spica Splint (to support your thumb and wrist muscle) - wear while active, pushing wheelchair, and at night. May use ice or heat as needed, avoid strenuous lifting. 2. For your Blood Pressure - Very important to take your BP medicine every day (same time in morning) - take HCTZ 25mg  daily and Amlodipine 10mg  daily (sent this new rx in, please pick up today at pharmacy) - Check BP at home every other day - if higher than >160 / 90 - give [place] a call and return for follow-up in 2 weeks rather than 4 Given sheet on mammogram - call [place] [phone]) to schedule Mammogram)  If you develop Chest pain or tightness, Shortness of Breath, Headache (not improving with medicine), loss of vision, signs of stroke with muscle weakness, slurred speech, or passing out, or other concerning symptoms, please call 911 immediately to go to ED for evaluation, high risk if BP remains high.  Please schedule a follow-up appointment with Dr. Parks Ranger in 4 weeks for Physical, pap smear, and BP follow-up  If you have any other questions or concerns, please feel free to call the clinic to contact me. You may also schedule an earlier appointment if necessary.  However, if your symptoms get significantly worse, please go to the Emergency Department to seek immediate medical attention.  [name], [place]

## 2014-07-27 NOTE — Progress Notes (Signed)
   Subjective:    Patient ID: Nicole Barrera, female    DOB: December 23, 1960, 54 y.o.   MRN: [id number]  Patient presents for a same day appointment.  HPI  CHRONIC HTN: - Patient recently seen in [facility] 07/24/14 for HTN significantly elevated at that time 190/100 with improvement to 170/100, started on Amlodipine 5mg  daily in addition to HCTZ 25mg  daily, returned 48 hours for nurse visit BP check on 07/25/13 with persistently elevated 170-190 SBP over 100 DBP (home readings similarly high), advised to increase Amlodipine to 10mg , follow-up 24 hours. - Today reports - now taking Amlodipine 10mg  total (2x 5mg  tabs), HCTZ 25mg  daily. Took meds today. Previously missing doses and issues with non-adherence Lifestyle - no regular exercise yet. Significantly reduced smoking, trying to reduce salt in diet. Stopped drinking caffeine (previously sodas, now only water). Denies CP, dyspnea, HA, edema, dizziness / lightheadedness, vision loss, focal weakness  TOBACCO ABUSE: - Chronic history smoking 25 years about 0.5 to 1ppd - Now recently reduced from 15 cigs daily down to 5 cigs daily for past 2 days. Never tried to quit smoking before, has not used NRT or medicines for smoking. She is very motivated to quit now given significantly elevated BP and concerns for her health.  - She decides today to set quit date on Sunday May 8 (Mother's Day) quit day  I have reviewed and updated the following as appropriate: allergies and current medications  Social Hx: - Currently smoking, 5 cigs daily reduced  Review of Systems  See above HPI    Objective:   Physical Exam  BP 158/92 mmHg  Pulse 78  Temp(Src) 98.2 F (36.8 C) (Oral)  Ht 5\' 4"  (1.626 m)  Wt 128 lb 12.8 oz (58.423 kg)  BMI 22.10 kg/m2  Gen - well-appearing, pleasant and cooperative, NAD HEENT - NCAT, PERRL, EOMI, MMM Neck - supple, non-tender Heart - RRR, no murmurs heard Skin - warm, dry, no rashes Neuro - awake, alert, oriented, grossly  non-focal with CN-II-XII intact, intact distal sensation to light touch, gait normal     Assessment & Plan:   See specific A&P problem list for details.

## 2014-07-27 NOTE — Assessment & Plan Note (Addendum)
Significantly improved HTN, remains elevated BP about 160/90, not at goal. Remains asymptomatic. However, previously uncontrolled with HTN urgency 190/100 within past 48 hours, asymptomatic at that time, previously uncontrolled due to medical non-adherence and poor lifestyle (smoker, stress, no special diet, no regular exercise) - H/o Lisinopril allergy - angioedema - Seems to be adherent to medicines - Reduced smoking - Quit caffeine, reduced salt intake  Plan: 1. Continue Amlodipine 10mg  (sent new rx 10mg  tabs, #90 with 3 refills into pharmacy), HCTZ 25mg  daily 2. Encouraged smoking cessation - quit date set 08/06/14 3. Check and log BP at home (family members RN), BP >160/90 return criteria given 4. Reviewed similar strict return precautions to go to ED if develops symptoms with elevated BP remains elevated 5. RTC 4 weeks for annual physical, re-check BP. If remains above goal >140/90, anticipate adding Losartan (risk 10% angioedema since hx with ACEI)

## 2014-08-25 ENCOUNTER — Encounter: Payer: Self-pay | Admitting: Family Medicine

## 2014-09-18 ENCOUNTER — Encounter (HOSPITAL_COMMUNITY): Payer: Self-pay | Admitting: Emergency Medicine

## 2014-09-18 ENCOUNTER — Encounter: Payer: Self-pay | Admitting: Family Medicine

## 2014-09-18 ENCOUNTER — Emergency Department (INDEPENDENT_AMBULATORY_CARE_PROVIDER_SITE_OTHER)
Admission: EM | Admit: 2014-09-18 | Discharge: 2014-09-18 | Disposition: A | Discharge status: Home / Self Care | Payer: Self-pay | Source: Home / Self Care | Attending: Family Medicine | Admitting: Family Medicine

## 2014-09-18 ENCOUNTER — Emergency Department (INDEPENDENT_AMBULATORY_CARE_PROVIDER_SITE_OTHER): Payer: Self-pay

## 2014-09-18 DIAGNOSIS — M654 Radial styloid tenosynovitis [de Quervain]: Secondary | ICD-10-CM

## 2014-09-18 MED ORDER — PREDNISONE 5 MG (48) PO TBPK: 5.0000 mg | ORAL_TABLET | Freq: Every day | ORAL | Status: DC

## 2014-09-18 NOTE — Discharge Instructions (Signed)
Thank you for coming in today. Use the splint.  Take prednisone.  Follow up with sports medicine if not better.    De Quervain's Tenosynovitis De Quervain's tenosynovitis involves inflammation of one or two tendon linings (sheaths) or strain of one or two tendons to the thumb: extensor pollicis brevis (EPB), or abductor pollicis longus (APL). This causes pain on the side of the wrist and base of the thumb. Tendon sheaths secrete a fluid that lubricates the tendon, allowing the tendon to move smoothly. When the sheath becomes inflamed, the tendon cannot move freely in the sheath. Both the EPB and APL tendons are important for proper use of the hand. The EPB tendon is important for straightening the thumb. The APL tendon is important for moving the thumb away from the index finger (abducting). The two tendons pass through a small tube (canal) in the wrist, near the base of the thumb. When the tendons become inflamed, pain is usually felt in this area. SYMPTOMS   Pain, tenderness, swelling, warmth, or redness over the base of the thumb and thumb side of the wrist.  Pain that gets worse when straightening the thumb.  Pain that gets worse when moving the thumb away from the index finger, against resistance.  Pain with pinching or gripping.  Locking or catching of the thumb.  Limited motion of the thumb.  Crackling sound (crepitation) when the tendon or thumb is moved or touched.  Fluid-filled cyst in the area of the base of the thumb. CAUSES   Tenosynovitis is often linked with overuse of the wrist.  Tenosynovitis may be caused by repeated injury to the thumb muscle and tendon units, and with repeated motions of the hand and wrist, due to friction of the tendon within the lining (sheath).  Tenosynovitis may also be due to a sudden increase in activity or change in activity. RISK INCREASES WITH:  Sports that involve repeated hand and wrist motions (golf, bowling, tennis, squash,  racquetball).  Heavy labor.  Poor physical wrist strength and flexibility.  Failure to warm up properly before practice or play.  Female gender.  New mothers who hold their baby's head for long periods or lift infants with thumbs in the infant's armpit (axilla). PREVENTION  Warm up and stretch properly before practice or competition.  Allow enough time for rest and recovery between practices and competition.  Maintain appropriate conditioning:  Cardiovascular fitness.  Forearm, wrist, and hand flexibility.  Muscle strength and endurance.  Use proper exercise technique. PROGNOSIS  This condition is usually curable within 6 weeks, if treated properly with non-surgical treatment and resting of the affected area.  RELATED COMPLICATIONS   Longer healing time if not properly treated or if not given enough time to heal.  Chronic inflammation, causing recurring symptoms of tenosynovitis. Permanent pain or restriction of movement.  Risks of surgery: infection, bleeding, injury to nerves (numbness of the thumb), continued pain, incomplete release of the tendon sheath, recurring symptoms, cutting of the tendons, tendons sliding out of position, weakness of the thumb, thumb stiffness. TREATMENT  First, treatment involves the use of medicine and ice, to reduce pain and inflammation. Patients are encouraged to stop or modify activities that aggravate the injury. Stretching and strengthening exercises may be advised. Exercises may be completed at home or with a therapist. You may be fitted with a brace or splint, to limit motion and allow the injury to heal. Your caregiver may also choose to give you a corticosteroid injection, to reduce the pain  and inflammation. If non-surgical treatment is not successful, surgery may be needed. Most tenosynovitis surgeries are done as outpatient procedures (you go home the same day). Surgery may involve local, regional (whole arm), or general anesthesia.    MEDICATION   If pain medicine is needed, nonsteroidal anti-inflammatory medicines (aspirin and ibuprofen), or other minor pain relievers (acetaminophen), are often advised.  Do not take pain medicine for 7 days before surgery.  Prescription pain relievers are often prescribed only after surgery. Use only as directed and only as much as you need.  Corticosteroid injections may be given if your caregiver thinks they are needed. There is a limited number of times these injections may be given. COLD THERAPY   Cold treatment (icing) should be applied for 10 to 15 minutes every 2 to 3 hours for inflammation and pain, and immediately after activity that aggravates your symptoms. Use ice packs or an ice massage. SEEK MEDICAL CARE IF:   Symptoms get worse or do not improve in 2 to 4 weeks, despite treatment.  You experience pain, numbness, or coldness in the hand.  Blue, gray, or dark color appears in the fingernails.  Any of the following occur after surgery: increased pain, swelling, redness, drainage of fluids, bleeding in the affected area, or signs of infection.  New, unexplained symptoms develop. (Drugs used in treatment may produce side effects.) Document Released: 03/17/2005 Document Revised: 06/09/2011 Document Reviewed: 06/29/2008 [facility] Patient Information 2015 [place], [place]. This information is not intended to replace advice given to you by your health care provider. Make sure you discuss any questions you have with your health care provider.

## 2014-09-18 NOTE — ED Notes (Signed)
Pt states she injured her right wrist about one month ago.  She was pushing her mother in a wheelchair when she says she "jammed" it and felt something pull.  Pt was seen by her PCP and given a wrist brace and told to take ibuprofen, which she states is not working.  Pt states it is still swollen and her pain is 8/10.

## 2014-09-18 NOTE — ED Provider Notes (Signed)
Nicole Barrera is a 54 y.o. female who presents to Urgent Care today for right wrist pain. Patient has right wrist pain for the last 2 months or so. Pain started after she jammed her wrist pushing a wheelchair. She was seen by her primary care provider at the end of April who diagnosed de Quervain's tenosynovitis and recommended a thumb spica splint and ibuprofen. She never used a thumb spica splints and intermittently has used ibuprofen. She notes continued radial wrist pain. No radiating pain weakness or numbness.   Past Medical History  Diagnosis Date  . HTN (hypertension)   . Tobacco abuse   . 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 PEPTIC ULCER DISEASE 11/19/2006    Qualifier: Diagnosis of  By: Radene Ou MD, [place]     History reviewed. No pertinent past surgical history. History  Substance Use Topics  . Smoking status: Current Every Day Smoker -- 0.50 packs/day    Types: Cigarettes  . Smokeless tobacco: Not on file  . Alcohol Use: No   ROS as above Medications: No current facility-administered medications for this encounter.   Current Outpatient Prescriptions  Medication Sig Dispense Refill  . amLODipine (NORVASC) 10 MG tablet Take 1 tablet (10 mg total) by mouth daily. 90 tablet 3  . hydrochlorothiazide (HYDRODIURIL) 25 MG tablet Take 1 tablet (25 mg total) by mouth daily. 90 tablet 3  . predniSONE (STERAPRED UNI-PAK 48 TAB) 5 MG (48) TBPK tablet Take 1 tablet (5 mg total) by mouth daily. 12 day dosepack po 48 tablet 0  . [DISCONTINUED] promethazine (PHENERGAN) 25 MG tablet Take 1 tablet (25 mg total) by mouth every 6 (six) hours as needed for nausea or vomiting. (Patient not taking: Reported on 07/24/2014) 30 tablet 0   Allergies  Allergen Reactions  . Lisinopril     angioedema     Exam:  BP 151/78 mmHg  Pulse 83  Temp(Src) 98.4 F (36.9 C) (Oral)  Resp 16  SpO2 100% Gen: Well NAD HEENT: EOMI,  MMM Right wrist:  Normal-appearing. Tender to palpation radial styloid. Positive Finkelstein's test. Negative tenderness in the anatomical snuff box. Normal wrist motion. Capillary refill pulses and sensation intact Exts: Brisk capillary refill, warm and well perfused.   No results found for this or any previous visit (from the past 24 hour(s)). Dg Hand Complete Right  09/18/2014   CLINICAL DATA:  Trauma 1 month ago. Pain. Pain centered at the lateral side of from radiating to wrist. Swelling.  EXAM: RIGHT HAND - COMPLETE 3+ VIEW  COMPARISON:  None.  FINDINGS: Overlap of fingers on the lateral view. Mild joint space narrowing involves the radiocarpal joint and base of the thumb. No significant soft tissue swelling.  IMPRESSION: Degenerative change, without acute osseous finding.   Electronically Signed   By: Abigail Miyamoto M.D.   On: 09/18/2014 16:40    Assessment and Plan: 54 y.o. female with de Quervain's tenosynovitis. Treat with thumb spica, (patient was given a thumb spica splint prior to discharge). We discussed steroid injection. Patient declined injection at this time electing for a trial of oral steroid's instead. Follow up with sports medicine if not better.   Discussed warning signs or symptoms. Please see discharge instructions. Patient expresses understanding.     Gregor Hams, MD 09/18/14 [id number]

## 2014-12-01 ENCOUNTER — Telehealth: Payer: Self-pay | Admitting: Family Medicine

## 2014-12-01 NOTE — Telephone Encounter (Signed)
Spoke with patient, she doesn't have insurance right now. Informed her that I would leave mammogram scholarship paperwork up front for her to fill out.

## 2014-12-01 NOTE — Telephone Encounter (Signed)
Pt called and would like a referral to have a mammogram done. She has not had this done before. jw

## 2015-01-15 ENCOUNTER — Telehealth: Payer: Self-pay | Admitting: Family Medicine

## 2015-01-15 ENCOUNTER — Encounter: Payer: Self-pay | Admitting: Family Medicine

## 2015-01-15 NOTE — Telephone Encounter (Signed)
Pt filled out application for mammography scholarship. I faxed it to [facility] at [phone]. Sadie Reynolds, ASA

## 2015-02-02 ENCOUNTER — Encounter: Payer: Self-pay | Admitting: Family Medicine

## 2015-02-05 ENCOUNTER — Encounter: Payer: Self-pay | Admitting: Family Medicine

## 2015-02-05 ENCOUNTER — Ambulatory Visit (INDEPENDENT_AMBULATORY_CARE_PROVIDER_SITE_OTHER): Payer: Self-pay | Admitting: Family Medicine

## 2015-02-05 VITALS — BP 130/82 | HR 87 | Temp 98.1°F | Wt 130.5 lb

## 2015-02-05 DIAGNOSIS — I872 Venous insufficiency (chronic) (peripheral): Secondary | ICD-10-CM

## 2015-02-05 DIAGNOSIS — I8312 Varicose veins of left lower extremity with inflammation: Secondary | ICD-10-CM

## 2015-02-05 DIAGNOSIS — I8311 Varicose veins of right lower extremity with inflammation: Secondary | ICD-10-CM

## 2015-02-05 NOTE — Patient Instructions (Addendum)
Elevate legs and use compression hose to reduce swelling, which will prevent rash from forming.  Also STOP smoking.  Venous Stasis or Chronic Venous Insufficiency Chronic venous insufficiency, also called venous stasis, is a condition that affects the veins in the legs. The condition prevents blood from being pumped through these veins effectively. Blood may no longer be pumped effectively from the legs back to the heart. This condition can range from mild to severe. With proper treatment, you should be able to continue with an active life. CAUSES  Chronic venous insufficiency occurs when the vein walls become stretched, weakened, or damaged or when valves within the vein are damaged. Some common causes of this include:  High blood pressure inside the veins (venous hypertension).  Increased blood pressure in the leg veins from long periods of sitting or standing.  A blood clot that blocks blood flow in a vein (deep vein thrombosis).  Inflammation of a superficial vein (phlebitis) that causes a blood clot to form. RISK FACTORS Various things can make you more likely to develop chronic venous insufficiency, including:  Family history of this condition.  Obesity.  Pregnancy.  Sedentary lifestyle.  Smoking.  Jobs requiring long periods of standing or sitting in one place.  Being a certain age. Women in their 10s and 70s and men in their 52s are more likely to develop this condition. SIGNS AND SYMPTOMS  Symptoms may include:   Varicose veins.  Skin breakdown or ulcers.  Reddened or discolored skin on the leg.  Brown, smooth, tight, and painful skin just above the ankle, usually on the inside surface (lipodermatosclerosis).  Swelling. DIAGNOSIS  To diagnose this condition, your health care provider will take a medical history and do a physical exam. The following tests may be ordered to confirm the diagnosis:  Duplex ultrasound--A procedure that produces a picture of a blood  vessel and nearby organs and also provides information on blood flow through the blood vessel.  Plethysmography--A procedure that tests blood flow.  A venogram, or venography--A procedure used to look at the veins using X-ray and dye. TREATMENT The goals of treatment are to help you return to an active life and to minimize pain or disability. Treatment will depend on the severity of the condition. Medical procedures may be needed for severe cases. Treatment options may include:   Use of compression stockings. These can help with symptoms and lower the chances of the problem getting worse, but they do not cure the problem.  Sclerotherapy--A procedure involving an injection of a material that "dissolves" the damaged veins. Other veins in the network of blood vessels take over the function of the damaged veins.  Surgery to remove the vein or cut off blood flow through the vein (vein stripping or laser ablation surgery).  Surgery to repair a valve. HOME CARE INSTRUCTIONS   Wear compression stockings as directed by your health care provider.  Only take over-the-counter or prescription medicines for pain, discomfort, or fever as directed by your health care provider.  Follow up with your health care provider as directed. SEEK MEDICAL CARE IF:   You have redness, swelling, or increasing pain in the affected area.  You see a red streak or line that extends up or down from the affected area.  You have a breakdown or loss of skin in the affected area, even if the breakdown is small.  You have an injury to the affected area. SEEK IMMEDIATE MEDICAL CARE IF:   You have an injury and  open wound in the affected area.  Your pain is severe and does not improve with medicine.  You have sudden numbness or weakness in the foot or ankle below the affected area, or you have trouble moving your foot or ankle.  You have a fever or persistent symptoms for more than 2-3 days.  You have a fever and  your symptoms suddenly get worse. MAKE SURE YOU:   Understand these instructions.  Will watch your condition.  Will get help right away if you are not doing well or get worse.   This information is not intended to replace advice given to you by your health care provider. Make sure you discuss any questions you have with your health care provider.   Document Released: 07/21/2006 Document Revised: 01/05/2013 Document Reviewed: 11/22/2012 Elsevier Interactive Patient Education [employer].

## 2015-02-05 NOTE — Progress Notes (Signed)
    Subjective: CC: rash on legs HPI: Patient is a 54 y.o. female presenting to clinic today for same day appt. Concerns today include:  Rash Patient reports that she has had a rash on her legs for some time now.  She notes that rash appears after she has been sitting for a while (ex when she plays Bingo) and they get swollen.  This has been ongoing since September.  She notes that rash is non painful and does not itch.  No fevers, URI symptoms, GI symptoms.  No new medications.  No h/o immunocompromised state.  No new pets, foods, lotions.  She notes that she did change detergent in October.  Patient does smoke.  Social History Reviewed: active smoker. FamHx and MedHx updated.  Please see EMR. Health Maintenance: Flu shot due  ROS: Per HPI  Objective: Office vital signs reviewed. BP 130/82 mmHg  Pulse 87  Temp(Src) 98.1 F (36.7 C) (Oral)  Wt 130 lb 8 oz (59.194 kg)  SpO2 97%  Physical Examination:  General: Awake, alert, well nourished, No acute distress, smell of tobacco prominent upon entry into the room HEENT: Normal, teeth missing Extremities: warm, well perfused, No edema, cyanosis or clubbing; +1 posterior tibial pulses bilaterally, LE are equal in size MSK: Normal gait and station Skin: dry, intact, non blanching, erythematous rash on bilateral medial aspects of lower extremities extending up to mid calf.  Non tender to palpation, non exudative, non bleeding    Assessment/ Plan: 54 y.o. female   1. Venous stasis dermatitis of both lower extremities.  No evidence of infection.  No new medications.  No h/o immunocompromised state.  Patient is an active smoker.  No red flag signs.  Could consider contact dermatitis as a DDx but have a low suspicion of this. - Compression and elevation - Smoking cessation  - Return precautions reviewed, see AVS - Follow up with Dr Jerene Pitch 11/30 as scheduled for care  Precepted with Dr Erin Hearing.  Janora Norlander, DO PGY-2,  [place]

## 2015-02-28 ENCOUNTER — Encounter: Payer: Self-pay | Admitting: Family Medicine

## 2015-08-06 ENCOUNTER — Ambulatory Visit: Payer: Self-pay | Admitting: Family Medicine

## 2015-08-22 ENCOUNTER — Other Ambulatory Visit: Payer: Self-pay | Admitting: Family Medicine

## 2015-08-22 DIAGNOSIS — I10 Essential (primary) hypertension: Secondary | ICD-10-CM

## 2015-12-19 ENCOUNTER — Ambulatory Visit (INDEPENDENT_AMBULATORY_CARE_PROVIDER_SITE_OTHER): Payer: Self-pay | Admitting: Family Medicine

## 2015-12-19 ENCOUNTER — Encounter: Payer: Self-pay | Admitting: Family Medicine

## 2015-12-19 VITALS — BP 144/81 | HR 86 | Temp 98.2°F | Ht 64.0 in | Wt 140.0 lb

## 2015-12-19 DIAGNOSIS — S39012A Strain of muscle, fascia and tendon of lower back, initial encounter: Secondary | ICD-10-CM

## 2015-12-19 DIAGNOSIS — M549 Dorsalgia, unspecified: Secondary | ICD-10-CM

## 2015-12-19 LAB — POCT UA - MICROSCOPIC ONLY

## 2015-12-19 LAB — POCT URINALYSIS DIPSTICK
BILIRUBIN UA: NEGATIVE
Blood, UA: NEGATIVE
GLUCOSE UA: NEGATIVE
KETONES UA: NEGATIVE
Nitrite, UA: NEGATIVE
Protein, UA: 30
SPEC GRAV UA: 1.02
Urobilinogen, UA: 0.2
pH, UA: 7

## 2015-12-19 MED ORDER — MELOXICAM 15 MG PO TABS: 15.0000 mg | ORAL_TABLET | Freq: Every day | ORAL | 2 refills | Status: DC

## 2015-12-19 NOTE — Assessment & Plan Note (Signed)
Patient is here with complaints of flank/back pain. Signs and symptoms consistent with paraspinal muscle strain, along the left side. Initial complaint was thought to be dysuria. UA was obtained and was noted to be likely benign. Will treat conservatively at this time. - Ice, rest, NSAIDs, and low intensity stretching exercises. - Prescription for Mobic provided. Take daily for 5 days then when necessary daily after that.

## 2015-12-19 NOTE — Progress Notes (Signed)
DYSURIA/FLANK PAIN Some flank pain over the past few days. No true dysuria. Seems to be worse with a full bladder. No vaginal discharge. No hematuria. Never had anything like this in the past. Worse with bending over and picking things up. Denies any injury. No numbness, weakness, or paresthesias.  Pain urinating started 3 days ago. Pain is: aching and burning Medications tried: no Any antibiotics in the last 30 days: no More than 3 UTIs in the last 12 months: no STD exposure: no Possibly pregnant: no  Symptoms Urgency: no Frequency: yes Blood in urine: no Pain in back: yes Fever: no Vaginal discharge: no Mouth Ulcers: no  Review of Symptoms - see HPI PMH - Smoking status noted.    CC, SH/smoking status, and VS noted  Objective: BP (!) 144/81   Pulse 86   Temp 98.2 F (36.8 C) (Oral)   Ht 5\' 4"  (1.626 m)   Wt 140 lb (63.5 kg)   BMI 24.03 kg/m  General -- NAD, pleasant and cooperative. Chest -- good expansion. Lungs clear to auscultation. Cardiac -- RRR. No murmurs noted.  Abdomen -- soft, nontender. No masses palpable. Bowel sounds present. CNS -- cranial nerves II through XII grossly intact. 2+ reflexes bilaterally. MSK -- no tenderness or effusions noted. Tenderness to palpation over the left lumbar paraspinal muscles. No tenderness over the SI joint. Negative straight leg raise. Negative FABER/FADIR. ROM good. 5/5 bilateral strength. Dorsalis pedis pulses present and symmetrical.    Assessment and plan:  Strain of lumbar paraspinal muscle Patient is here with complaints of flank/back pain. Signs and symptoms consistent with paraspinal muscle strain, along the left side. Initial complaint was thought to be dysuria. UA was obtained and was noted to be likely benign. Will treat conservatively at this time. - Ice, rest, NSAIDs, and low intensity stretching exercises. - Prescription for Mobic provided. Take daily for 5 days then when necessary daily after  that.   Orders Placed This Encounter  Procedures  . Urinalysis Dipstick  . POCT UA - Microscopic Only    Meds ordered this encounter  Medications  . meloxicam (MOBIC) 15 MG tablet    Sig: Take 1 tablet (15 mg total) by mouth daily.    Dispense:  30 tablet    Refill:  2     Elberta Leatherwood, MD,MS,  PGY3 12/19/2015 5:36 PM

## 2015-12-19 NOTE — Patient Instructions (Signed)
It was a pleasure seeing you today in our clinic. Today we discussed your back pain. Here is the treatment plan we have discussed and agreed upon together:   I think you have a strain of the muscles that stabilized your low back. - Ice this region regularly for 20-30 minutes 2-3 times a day. - Low intensity stretching exercises may be beneficial. - Do your best to refrain from lifting heavy objects over the next 1 week. - Take the prescribed Mobic once a day for the next 5 days. Then take this once a day as needed for pain.

## 2016-03-03 ENCOUNTER — Telehealth: Payer: Self-pay | Admitting: Family Medicine

## 2016-03-03 DIAGNOSIS — Z1231 Encounter for screening mammogram for malignant neoplasm of breast: Secondary | ICD-10-CM

## 2016-03-03 NOTE — Telephone Encounter (Signed)
Pt wants referral for mammogram

## 2016-03-04 ENCOUNTER — Telehealth: Payer: Self-pay | Admitting: Family Medicine

## 2016-03-04 NOTE — Telephone Encounter (Signed)
Entered in error. -- Harriet Butte, [place], PGY-1

## 2016-03-04 NOTE — Telephone Encounter (Signed)
Screening mammogram ordered. Needs to set up appointment. Please call patient to inform them to go to [place] for imaging. Thanks. -- Harriet Butte, [place], PGY-1

## 2016-03-04 NOTE — Telephone Encounter (Signed)
Will forward to team.  RN does not work on referrals.  Derl Barrow, RN

## 2016-03-05 NOTE — Telephone Encounter (Signed)
Pt informed and given the number. States she will call an appt once she gets scholarship in the mail. Deseree Kennon Holter, CMA

## 2016-03-26 ENCOUNTER — Other Ambulatory Visit: Payer: Self-pay | Admitting: Family Medicine

## 2016-03-26 DIAGNOSIS — Z1231 Encounter for screening mammogram for malignant neoplasm of breast: Secondary | ICD-10-CM

## 2016-05-09 ENCOUNTER — Ambulatory Visit
Admission: RE | Admit: 2016-05-09 | Discharge: 2016-05-09 | Disposition: A | Discharge status: Home / Self Care | Payer: No Typology Code available for payment source | Source: Ambulatory Visit | Attending: Family Medicine | Admitting: Family Medicine

## 2016-05-09 DIAGNOSIS — Z1231 Encounter for screening mammogram for malignant neoplasm of breast: Secondary | ICD-10-CM

## 2016-05-16 ENCOUNTER — Other Ambulatory Visit: Payer: Self-pay | Admitting: Family Medicine

## 2016-05-16 DIAGNOSIS — I10 Essential (primary) hypertension: Secondary | ICD-10-CM

## 2016-05-21 ENCOUNTER — Other Ambulatory Visit: Payer: Self-pay | Admitting: Family Medicine

## 2016-05-21 DIAGNOSIS — I10 Essential (primary) hypertension: Secondary | ICD-10-CM

## 2016-05-21 MED ORDER — AMLODIPINE BESYLATE 10 MG PO TABS: 10.0000 mg | ORAL_TABLET | Freq: Every day | ORAL | 1 refills | Status: DC

## 2016-05-21 NOTE — Telephone Encounter (Signed)
Pt needs a refill on amlodipine. Pt has an appointment with PCP 06-13-16 and needs at least enough to last until then. Pt uses [facility] @ [address]. ep

## 2016-06-13 ENCOUNTER — Ambulatory Visit: Payer: Self-pay | Admitting: Family Medicine

## 2016-06-13 NOTE — Progress Notes (Deleted)
   Subjective:   Patient ID: Vira Chaplin Bobak    DOB: November 30, 1960, 56 y.o. female   MRN: [id number]  CC: "***"  HPI: Zuriah Bordas Fern is a 56 y.o. female who presents to clinic today ***. Problems discussed today are as follows:  ***: ***  ROS: complete ROS performed, see HPI for pertinent ROS.  [place]: HTN, tobacco abuse disorder, trigger thumb L hand. Smoking status reviewed. Medications reviewed.  Objective:   There were no vitals taken for this visit. Vitals and nursing note reviewed.  General: well nourished, well developed, in no acute distress with non-toxic appearance HEENT: normocephalic, atraumatic, moist mucous membranes Neck: supple, non-tender without lymphadenopathy CV: regular rate and rhythm without murmurs, rubs, or gallops, no lower extremity edema Lungs: clear to auscultation bilaterally with normal work of breathing Abdomen: soft, non-tender, non-distended, no masses or organomegaly palpable, normoactive bowel sounds Skin: warm, dry, no rashes or lesions, cap refill < 2 seconds Extremities: warm and well perfused, normal tone  Assessment & Plan:   No problem-specific Assessment & Plan notes found for this encounter.  No orders of the defined types were placed in this encounter.  No orders of the defined types were placed in this encounter.   Harriet Butte, [place], PGY-1 06/13/2016 12:23 PM

## 2016-06-30 ENCOUNTER — Other Ambulatory Visit: Payer: Self-pay | Admitting: Family Medicine

## 2016-06-30 DIAGNOSIS — I10 Essential (primary) hypertension: Secondary | ICD-10-CM

## 2016-06-30 MED ORDER — HYDROCHLOROTHIAZIDE 25 MG PO TABS: 25.0000 mg | ORAL_TABLET | Freq: Every day | ORAL | 3 refills | Status: DC

## 2016-06-30 NOTE — Telephone Encounter (Signed)
Patient calling to request refill of:  Name of Medication(s): HCTZ Last date of OV: 12/19/2015 Pharmacy:  [facility]  I scheduled an appt for patient to see Dr. Yisroel Ramming on 4/20 @ 2PM. Please refill meds now, patient is out.   Will route refill request to Clinic RN.  Discussed with patient policy to call pharmacy for future refills.  Also, discussed refills may take up to 48 hours to approve or deny.  [place]

## 2016-07-01 ENCOUNTER — Other Ambulatory Visit: Payer: Self-pay | Admitting: *Deleted

## 2016-07-01 DIAGNOSIS — I10 Essential (primary) hypertension: Secondary | ICD-10-CM

## 2016-07-01 MED ORDER — AMLODIPINE BESYLATE 10 MG PO TABS: 10.0000 mg | ORAL_TABLET | Freq: Every day | ORAL | 1 refills | Status: DC

## 2016-07-17 NOTE — Progress Notes (Deleted)
   Subjective:   Patient ID: Nicole Barrera Ewer    DOB: 1960-11-01, 56 y.o. female   MRN: [id number]  CC: "***"  HPI: Nicole Barrera is a 56 y.o. female who presents to clinic today ***. Problems discussed today are as follows:  ***: ***  ***needs colonoscopy, HCV, Tdap, PAP.  ROS: complete ROS performed, see HPI for pertinent ROS.  [place]: HTN, tobacco use disorder. Smoking status reviewed. Medications reviewed.  Objective:   There were no vitals taken for this visit. Vitals and nursing note reviewed.  General: well nourished, well developed, in no acute distress with non-toxic appearance HEENT: normocephalic, atraumatic, moist mucous membranes Neck: supple, non-tender without lymphadenopathy CV: regular rate and rhythm without murmurs, rubs, or gallops, no lower extremity edema Lungs: clear to auscultation bilaterally with normal work of breathing Abdomen: soft, non-tender, non-distended, no masses or organomegaly palpable, normoactive bowel sounds Skin: warm, dry, no rashes or lesions, cap refill < 2 seconds Extremities: warm and well perfused, normal tone  Assessment & Plan:   No problem-specific Assessment & Plan notes found for this encounter.  No orders of the defined types were placed in this encounter.  No orders of the defined types were placed in this encounter.   Harriet Butte, [place], PGY-1 07/17/2016 8:27 PM

## 2016-07-18 ENCOUNTER — Ambulatory Visit: Payer: No Typology Code available for payment source | Admitting: Family Medicine

## 2016-07-28 ENCOUNTER — Ambulatory Visit (INDEPENDENT_AMBULATORY_CARE_PROVIDER_SITE_OTHER): Payer: Self-pay | Admitting: Family Medicine

## 2016-07-28 ENCOUNTER — Encounter: Payer: Self-pay | Admitting: Family Medicine

## 2016-07-28 VITALS — BP 128/72 | HR 74 | Temp 98.3°F | Ht 64.0 in | Wt 133.0 lb

## 2016-07-28 DIAGNOSIS — Z0000 Encounter for general adult medical examination without abnormal findings: Secondary | ICD-10-CM | POA: Insufficient documentation

## 2016-07-28 DIAGNOSIS — F17200 Nicotine dependence, unspecified, uncomplicated: Secondary | ICD-10-CM

## 2016-07-28 DIAGNOSIS — Z1159 Encounter for screening for other viral diseases: Secondary | ICD-10-CM

## 2016-07-28 MED ORDER — TETANUS-DIPHTH-ACELL PERTUSSIS 5-2-15.5 LF-MCG/0.5 IM SUSP: 0.5000 mL | Freq: Once | INTRAMUSCULAR | 0 refills | Status: AC

## 2016-07-28 NOTE — Assessment & Plan Note (Addendum)
First time meeting new PCP since 2017. Overdue for PAP since 2011 (negative results last screen). Never screened with colonoscopy or HCV. Needs Tdap booster. --Discussed importance of colonoscopy and importance in preventing colorectal cancer, provided paperwork to sch screening --Discussed importance of PAP to prevent cervical cancer, given [facility] contact to sch PAP due to self-pay --Will screen HCV --Tdap Rx given, instructed to go to [facility] due to lower cost --RTC 3 months

## 2016-07-28 NOTE — Assessment & Plan Note (Signed)
Chronic. Actively quitting. Attempting cold [place] w/o medication or nicotine replacement. Seems motivated. Has 40 pack year. Down from 1 ppd to 0.5 ppd. Has not decided on quit date. --Discussed at length the importance of smoking cessation --Given 1-800-QUIT-NOW line --Instructed to schedule smoking cessation apt w/ Dr. Valentina Lucks

## 2016-07-28 NOTE — Progress Notes (Signed)
   Subjective:   Patient ID: Nicole Barrera    DOB: [birth year], 56 y.o. female   MRN: [id number]  CC: "Blood pressure"  HPI: Nicole Barrera is a 56 y.o. female who presents to clinic today for blood pressure. Problems discussed today are as follows:  Preventative care: Patient is overdue for Pap smear, HCV screen, Tdap vaccination, and screening colonoscopy. Denies FHx of colorectal cancer. ROS: denies vaginal bleeding, RUQ pain, n/v, melena, hematachezia  High blood pressure: Patient states she does not check her blood pressure regularly. Takes amlodipine 10 mg daily and HCTZ 25 mg daily. Tolerating medications well without side effects.  Tobacco use disorder: Has a 40-pack-year history. Formally smoked 1 pack per day. Sister has COPD and patient has been working on cutting back without use of nicotine or other medications for the past month. Has reduced to 10 cigarettes daily. Wanting to stop. Not tried nicotine replacement. ROS: denies CP, SOB, cough, wheeze, claudication  ROS: complete ROS performed, see HPI for pertinent ROS.  [place]: HTN, trigger thumb left hand, venous stasis dermatitis bilateral lower extremities, tobacco use disorder, systolic murmur. Smoking status reviewed. Medications reviewed.  Objective:   BP 128/72   Pulse 74   Temp 98.3 F (36.8 C)   Ht 5\' 4"  (1.626 m)   Wt 133 lb (60.3 kg)   SpO2 99%   BMI 22.83 kg/m  Vitals and nursing note reviewed.  General: well nourished, well developed, in no acute distress with non-toxic appearance HEENT: normocephalic, atraumatic, moist mucous membranes Neck: supple, non-tender without lymphadenopathy CV: regular rate and rhythm with systolic murmur without rubs, or gallops, no lower extremity edema Lungs: clear to auscultation bilaterally with normal work of breathing Abdomen: soft, non-tender, non-distended, no masses or organomegaly palpable, normoactive bowel sounds Skin: warm, dry, no rashes or lesions, cap  refill < 2 seconds Extremities: warm and well perfused, normal tone  Assessment & Plan:   Tobacco use disorder Chronic. Actively quitting. Attempting cold [place] w/o medication or nicotine replacement. Seems motivated. Has 40 pack year. Down from 1 ppd to 0.5 ppd. Has not decided on quit date. --Discussed at length the importance of smoking cessation --Given 1-800-QUIT-NOW line --Instructed to schedule smoking cessation apt w/ Dr. Valentina Lucks  Encounter for preventative adult health care examination First time meeting new PCP since 2017. Overdue for PAP since 2011 (negative results last screen). Never screened with colonoscopy or HCV. Needs Tdap booster. --Discussed importance of colonoscopy and importance in preventing colorectal cancer, provided paperwork to sch screening --Discussed importance of PAP to prevent cervical cancer, given [facility] contact to sch PAP due to self-pay --Will screen HCV --Tdap Rx given, instructed to go to [facility] due to lower cost --RTC 3 months  Orders Placed This Encounter  Procedures  . Hepatitis C antibody   Meds ordered this encounter  Medications  . Tdap (ADACEL) 07-30-13.5 LF-MCG/0.5 injection    Sig: Inject 0.5 mLs into the muscle once.    Dispense:  0.5 mL    Refill:  0    Harriet Butte, DO [place], PGY-1 07/28/2016 5:27 PM

## 2016-07-28 NOTE — Patient Instructions (Addendum)
Thank you for coming in to see [place] today. Please see below to review our plan for today's visit.  1. Grad[place] on cutting back on her smoking. This is very difficult to do. I would like you to schedule a smoking cessation appointment with Dr. Valentina Lucks. In the meantime, you can call 1-800-quit now for free resources. 2. I will notify you of your blood work results. 3. I have given you a paper to establish a colonoscopy screening. He can call either the practices to set this up. 4. For your overdue Pap smear, you can call [phone] at [facility] program for [facility]. This allows you to get your screening for free. 5. Your blood pressure is now well controlled. Please continue taking your medications as prescribed. 6. Return to clinic in 3 months.  Please call the clinic at [phone] if your symptoms worsen or you have any concerns. It was my pleasure to see you. -- Harriet Butte, [place], PGY-1

## 2016-07-29 ENCOUNTER — Encounter: Payer: Self-pay | Admitting: Family Medicine

## 2016-07-29 LAB — HEPATITIS C ANTIBODY: Hep C Virus Ab: <0.1 s/co ratio (ref 0.0–0.9)

## 2016-08-29 ENCOUNTER — Ambulatory Visit: Payer: No Typology Code available for payment source

## 2016-12-10 ENCOUNTER — Ambulatory Visit: Payer: No Typology Code available for payment source | Admitting: Family Medicine

## 2016-12-10 NOTE — Progress Notes (Deleted)
   Subjective:   Patient ID: Nicole Barrera    DOB: 1960/07/12, 56 y.o. female   MRN: [id number]  CC: "***"  HPI: Nicole Barrera is a 56 y.o. female who presents to clinic today ***. Problems discussed today are as follows:  ***: *** ROS: ***  ***Last seen 4/30. Received Tdap Rx and told to go to [facility] for PAP. Given colonoscopy paper. HCV neg. Discussed smoking rec f/u with [place].  Complete ROS performed, see HPI for pertinent.  [place]: HTN, trigger thumb left hand, venous stasis dermatitis bilateral lower extremities, tobacco use disorder, systolic murmur. Surgical history none. Family history breast cancer (mother). Smoking status reviewed. Medications reviewed.  Objective:   There were no vitals taken for this visit. Vitals and nursing note reviewed.  General: well nourished, well developed, in no acute distress with non-toxic appearance HEENT: normocephalic, atraumatic, moist mucous membranes Neck: supple, non-tender without lymphadenopathy CV: regular rate and rhythm without murmurs, rubs, or gallops, no lower extremity edema Lungs: clear to auscultation bilaterally with normal work of breathing Abdomen: soft, non-tender, non-distended, no masses or organomegaly palpable, normoactive bowel sounds Skin: warm, dry, no rashes or lesions, cap refill < 2 seconds Extremities: warm and well perfused, normal tone  Assessment & Plan:   No problem-specific Assessment & Plan notes found for this encounter.  No orders of the defined types were placed in this encounter.  No orders of the defined types were placed in this encounter.   Harriet Butte, [place], PGY-2 12/10/2016 7:54 AM

## 2016-12-30 NOTE — Progress Notes (Signed)
   Subjective:   Patient ID: Nicole Barrera    DOB: [birth year], 56 y.o. female   MRN: [id number]  CC: "BP check and trigger finger issues"  HPI: Nicole Barrera [place] is a 56 y.o. female who presents to clinic today BP check and trigger finger issues. Problems discussed today are as follows:  Hypertension: Taking her antihypertensives daily but at inconsistent times during the day. She states she rarely misses a dose. She continues to smoke.  Trigger finger: Recurrent issue on left thumb. Was previously seen and received steroid injection back in 2015 with successful resolution of symptoms for over one year. Patient is interested in receiving another injection today. She currently has a thumb spica splint at home without improvement and use of NSAIDs. ROS: Denies myalgias, joint pains in other regions, edema, rash.  Smoking: Current every day smoker, 0.5 packs per day. Has tried quitting 4 times continues to smoke because it "makes her relax." She is not interested in cessation today. ROS: Denies fevers or chills, cough, chest pain, shortness of breath.  Complete ROS performed, see HPI for pertinent.  [place]: HTN, trigger thumb left hand, venous stasis dermatitis bilateral lower extremities, tobacco use disorder, systolic murmur. Surgical history unremarkable. Family history breast cancer (mother). Smoking status reviewed. Medications reviewed.  Objective:   BP (!) 146/80   Pulse 63   Temp 98.3 F (36.8 C) (Oral)   Ht 5\' 4"  (1.626 m)   Wt 130 lb 6.4 oz (59.1 kg)   SpO2 99%   BMI 22.38 kg/m  Vitals and nursing note reviewed.  General: well nourished, well developed, in no acute distress with non-toxic appearance HEENT: normocephalic, atraumatic, moist mucous membranes Neck: supple, non-tender without lymphadenopathy CV: regular rate and rhythm without murmurs, rubs, or gallops, no lower extremity edema Lungs: clear to auscultation bilaterally with normal work of breathing Skin: warm,  dry, no rashes or lesions, cap refill < 2 seconds Extremities: warm and well perfused, normal tone, palpable nodule on MCP of the left thumb with mild tenderness, positive de Quervain's sign with tenderness at radial surface  Procedure Note: Left Thumb MCP Injection Patient was given informed consent, signed copy in the chart. Appropriate time out was taken. Area prepped and draped in usual sterile fashion. 1 cc of methylprednisolone 40 mg/ml plus  1 cc of 2% lidocaine without epinephrine was injected into the volar surface of MCP joint of left thumb using a(n) lateral approach. The patient tolerated the procedure well. There were no complications. Post procedure instructions were given.  Assessment & Plan:   Tobacco use disorder Chronic. Not interested in cessation. --Given information for 1-800-QUIT-NOW --Would benefit from low contrast CT though paying out of pocket, therefore will defer at this time  Trigger thumb of left hand Acute on chronic. History of steroid injection was successful resolution of symptoms. Patient agreeable to repeat injection today. Adherent to use of thumb spica splint and NSAIDs. --Performed injection at left MCP thumb, see procedure note  Primary hypertension Chronic. Controlled. Current every day smoker. Not interested in cessation. No recent labs though this is complicated due to lack of insurance. --Encouraged patient to complete paperwork for orange card --Continue amlodipine 10 mg daily and HCTZ 25 mg daily  No orders of the defined types were placed in this encounter.  Meds ordered this encounter  Medications  . methylPREDNISolone acetate (DEPO-MEDROL) injection 40 mg    Harriet Butte, DO [place], PGY-2 12/31/2016 5:12 PM

## 2016-12-31 ENCOUNTER — Encounter: Payer: Self-pay | Admitting: Family Medicine

## 2016-12-31 ENCOUNTER — Ambulatory Visit (INDEPENDENT_AMBULATORY_CARE_PROVIDER_SITE_OTHER): Payer: Self-pay | Admitting: Family Medicine

## 2016-12-31 VITALS — BP 146/80 | HR 63 | Temp 98.3°F | Ht 64.0 in | Wt 130.4 lb

## 2016-12-31 DIAGNOSIS — M65312 Trigger thumb, left thumb: Secondary | ICD-10-CM

## 2016-12-31 DIAGNOSIS — F17200 Nicotine dependence, unspecified, uncomplicated: Secondary | ICD-10-CM

## 2016-12-31 DIAGNOSIS — I10 Essential (primary) hypertension: Secondary | ICD-10-CM

## 2016-12-31 MED ORDER — METHYLPREDNISOLONE ACETATE 40 MG/ML IJ SUSP
40.0000 mg | Freq: Once | INTRAMUSCULAR | Status: AC
  Administered 2016-12-31: 40 mg via INTRAMUSCULAR

## 2016-12-31 NOTE — Assessment & Plan Note (Addendum)
Chronic. Not interested in cessation. --Given information for 1-800-QUIT-NOW --Would benefit from low contrast CT though paying out of pocket, therefore will defer at this time

## 2016-12-31 NOTE — Patient Instructions (Addendum)
Thank you for coming in to see [place] today. Please see below to review our plan for today's visit.  1. He should start feeling relief of your trigger finger over the next couple of days. I have set up an appointment at 1:30 PM next week on 01/07/2017 for you to receive the other injection. If your current sternum that you're splint needs to be replaced, would advise she to check her local pharmacy. 2. Continue your current medications. When you're ready for smoking cessation lab me now. In the meantime you can call 1-800-QUIT-NOW. 3. It is very important for you to complete orange card application. Once this is completed, we can get your lab tests.  Please call the clinic at [phone] if your symptoms worsen or you have any concerns. It was my pleasure to see you. -- Harriet Butte, [place], PGY-2

## 2016-12-31 NOTE — Assessment & Plan Note (Addendum)
Chronic. Controlled. Current every day smoker. Not interested in cessation. No recent labs though this is complicated due to lack of insurance. --Encouraged patient to complete paperwork for orange card --Continue amlodipine 10 mg daily and HCTZ 25 mg daily

## 2016-12-31 NOTE — Assessment & Plan Note (Addendum)
Acute on chronic. History of steroid injection was successful resolution of symptoms. Patient agreeable to repeat injection today. Adherent to use of thumb spica splint and NSAIDs. --Performed injection at left MCP thumb, see procedure note

## 2017-01-07 ENCOUNTER — Ambulatory Visit: Payer: Self-pay | Admitting: Family Medicine

## 2017-01-07 NOTE — Assessment & Plan Note (Deleted)
A 

## 2017-01-07 NOTE — Progress Notes (Deleted)
   Subjective:   Patient ID: Nicole Barrera    DOB: [birth year], 56 y.o. female   MRN: [id number]  CC: "Left wrist pain"  HPI: Nicole Barrera is a 56 y.o. female who presents to clinic today left wrist pain. Problems discussed today are as follows:  Left wrist pain: *** ROS: ***  Complete ROS performed, see HPI for pertinent.  [place]: HTN, trigger thumb left hand, left DeQuervain tenosynovitis, venous stasis dermatitis bilateral lower extremities, tobacco use disorder, systolic murmur. Surgical history unremarkable. Family history breast cancer (mother). Smoking status reviewed. Medications reviewed.  Objective:   There were no vitals taken for this visit. Vitals and nursing note reviewed.  General: well nourished, well developed, in no acute distress with non-toxic appearance HEENT: normocephalic, atraumatic, moist mucous membranes Neck: supple, non-tender without lymphadenopathy CV: regular rate and rhythm without murmurs, rubs, or gallops, no lower extremity edema Lungs: clear to auscultation bilaterally with normal work of breathing Abdomen: soft, non-tender, non-distended, no masses or organomegaly palpable, normoactive bowel sounds Skin: warm, dry, no rashes or lesions, cap refill < 2 seconds Extremities: warm and well perfused, normal tone  Procedure Note: Left Wrist Injection Patient was given informed consent, signed copy in the chart. Appropriate time out was taken. Area prepped and draped in usual sterile fashion. *** cc of methylprednisolone 40 mg/ml plus  *** cc of 1% lidocaine without epinephrine was injected into the *** using a(n)*** approach. The patient tolerated the procedure well. There were no complications. Post procedure instructions were given.  Assessment & Plan:   No problem-specific Assessment & Plan notes found for this encounter.  No orders of the defined types were placed in this encounter.  No orders of the defined types were placed in this  encounter.   Harriet Butte, [place], PGY-2 01/07/2017 1:12 PM

## 2017-04-06 ENCOUNTER — Telehealth: Payer: Self-pay | Admitting: Family Medicine

## 2017-04-06 NOTE — Telephone Encounter (Signed)
Received a scholarship for mammogram last year. It is time for mammogram again. Pt would like referral for the mammogram and information on how to receive the scholarship again.

## 2017-04-08 ENCOUNTER — Other Ambulatory Visit: Payer: Self-pay | Admitting: Family Medicine

## 2017-04-08 DIAGNOSIS — Z1239 Encounter for other screening for malignant neoplasm of breast: Secondary | ICD-10-CM

## 2017-04-08 NOTE — Telephone Encounter (Signed)
Order for screening mammogram placed. Not sure about scholarship. May no longer be available? Please advise.

## 2017-04-09 NOTE — Telephone Encounter (Signed)
Pt informed that I will be mailing her a form to fill out to mail back to the breast center. Deseree Kennon Holter, CMA

## 2017-05-19 NOTE — Progress Notes (Deleted)
   Subjective   Patient ID: Terrah Decoster Nicklas    DOB: June 05, 1960, 57 y.o. female   MRN: [id number]  CC: "***"  HPI: Jeniece Hannis Jessop is a 57 y.o. female who presents to clinic today for the following:  ***: ***  ***Last seen by me 12/2016 for resolved trigger finger.  Not interested in smoking cessation. Orange card to obtain labs for HTN? HM flu, Tdap, PAP, colposcopy.  ROS: see HPI for pertinent.  [place]: HTN, trigger thumb left hand, venous stasis dermatitis bilateral lower extremities, tobacco use disorder, systolic murmur. Surgical history unremarkable. Family history breast cancer (mother). Smoking status reviewed. Medications reviewed.  Objective   There were no vitals taken for this visit. Vitals and nursing note reviewed.  General: well nourished, well developed, NAD with non-toxic appearance HEENT: normocephalic, atraumatic, moist mucous membranes Neck: supple, non-tender without lymphadenopathy Cardiovascular: regular rate and rhythm without murmurs, rubs, or gallops Lungs: clear to auscultation bilaterally with normal work of breathing Abdomen: soft, non-tender, non-distended, normoactive bowel sounds Skin: warm, dry, no rashes or lesions, cap refill < 2 seconds Extremities: warm and well perfused, normal tone, no edema  Assessment & Plan   No problem-specific Assessment & Plan notes found for this encounter.  No orders of the defined types were placed in this encounter.  No orders of the defined types were placed in this encounter.   Harriet Butte, [place], PGY-2 05/19/2017, 7:22 PM

## 2017-05-20 ENCOUNTER — Ambulatory Visit: Payer: Self-pay | Admitting: Family Medicine

## 2017-06-01 ENCOUNTER — Other Ambulatory Visit: Payer: Self-pay | Admitting: *Deleted

## 2017-06-01 DIAGNOSIS — I10 Essential (primary) hypertension: Secondary | ICD-10-CM

## 2017-06-01 MED ORDER — AMLODIPINE BESYLATE 10 MG PO TABS: 10.0000 mg | ORAL_TABLET | Freq: Every day | ORAL | 1 refills | Status: DC

## 2017-07-01 ENCOUNTER — Other Ambulatory Visit: Payer: Self-pay | Admitting: Obstetrics and Gynecology

## 2017-07-01 DIAGNOSIS — Z1231 Encounter for screening mammogram for malignant neoplasm of breast: Secondary | ICD-10-CM

## 2017-07-21 ENCOUNTER — Ambulatory Visit (HOSPITAL_COMMUNITY): Payer: Self-pay

## 2017-08-06 ENCOUNTER — Other Ambulatory Visit: Payer: Self-pay | Admitting: Family Medicine

## 2017-08-06 DIAGNOSIS — I10 Essential (primary) hypertension: Secondary | ICD-10-CM

## 2017-08-07 NOTE — Telephone Encounter (Signed)
Needs refill on HCTZ.  [place]. Pt is completely out of the medication

## 2017-09-04 ENCOUNTER — Ambulatory Visit (HOSPITAL_COMMUNITY)
Admission: RE | Admit: 2017-09-04 | Discharge: 2017-09-04 | Disposition: A | Discharge status: Home / Self Care | Payer: Self-pay | Source: Ambulatory Visit | Attending: Radiology | Admitting: Radiology

## 2017-09-04 ENCOUNTER — Ambulatory Visit (INDEPENDENT_AMBULATORY_CARE_PROVIDER_SITE_OTHER): Payer: Self-pay | Admitting: Family Medicine

## 2017-09-04 ENCOUNTER — Other Ambulatory Visit: Payer: Self-pay

## 2017-09-04 ENCOUNTER — Encounter: Payer: Self-pay | Admitting: Family Medicine

## 2017-09-04 VITALS — BP 104/62 | Temp 98.3°F | Wt 128.6 lb

## 2017-09-04 DIAGNOSIS — I498 Other specified cardiac arrhythmias: Secondary | ICD-10-CM | POA: Insufficient documentation

## 2017-09-04 DIAGNOSIS — R002 Palpitations: Secondary | ICD-10-CM

## 2017-09-04 NOTE — Assessment & Plan Note (Signed)
Acute.  EKG NSR without heart block or ST changes or T wave abnormalities.  QTc appropriate.  Will need to rule out thyroid abnormality.  No signs of thyroid disease on physical exam.  Suspect excessive caffeine intake is contributing to palpitations.  Does not seem to bother patient.  Would also consider anxiety so this may be more related to her caffeine intake. - Checking TSH level - RTC 4 weeks to discuss smoking cessation

## 2017-09-04 NOTE — Patient Instructions (Signed)
Thank you for coming in to see [place] today. Please see below to review our plan for today's visit.  1.  I believe your palpitations are related to your excessive Pepsi intake.  You need to reduce your consumption to no more than 8 ounces per day.  Avoid other caffeine beverages.  We will check your thyroid level today.  I will call you in the next 24-48 hours if your results are abnormal, otherwise expect to receive results in the mail. 2.  I am pleased to hear you are interested in smoking cessation.  There are many options including nicotine replacement therapy and Chantix.  We can discuss these options at your next visit in 1 month.  Please call the clinic at [phone] if your symptoms worsen or you have any concerns. It was our pleasure to serve you.  Harriet Butte, [place], PGY-2

## 2017-09-04 NOTE — Progress Notes (Signed)
   Subjective   Patient ID: Nicole Barrera    DOB: 1960/10/31, 57 y.o. female   MRN: [id number]  CC: "Palpitations"  HPI: Nicole Barrera is a 57 y.o. female who presents for a same day appointment for the following:  Palpitations: Onset 3 days ago when patient was watching TV.  Had another 1 days following.  Intermittent without symptoms of chest pain, shortness of breath, syncope, fatigue, dizziness.  She denies nausea or vomiting, diarrhea, abdominal pain, cough. Patient thinks it may be due to her Pepsi intake. She drinks four 16 oz per day which is a reduction from her previous consumption. Patient does not drink coffee. She is a smoker, 1/2 ppd. She has considered quitting. Patient says her daughter thinks she is hyper and anxious. Patient has told she has anxious feelings for many years but has not had episodes of palpitations in the past.  ROS: see HPI for pertinent.  [place]: HTN, venous stasis, back to use disorder, systolic murmur, trigger finger.  Surgical history unremarkable.  Family history breast cancer (mother and sister).  Smoking status reviewed. Medications reviewed.  Objective   BP 104/62   Temp 98.3 F (36.8 C) (Oral)   Wt 128 lb 9.6 oz (58.3 kg)   BMI 22.07 kg/m  Vitals and nursing note reviewed.  General: well nourished, well developed, NAD with non-toxic appearance HEENT: normocephalic, atraumatic, moist mucous membranes, no proptosis Neck: supple, non-tender without lymphadenopathy, no thyromegaly Cardiovascular: regular rate and rhythm without murmurs, rubs, or gallops Lungs: clear to auscultation bilaterally with normal work of breathing Abdomen: soft, non-tender, non-distended, normoactive bowel sounds Skin: warm, dry, no rashes or lesions, cap refill < 2 seconds Extremities: warm and well perfused, normal tone, no edema  Assessment & Plan   Palpitations Acute.  EKG NSR without heart block or ST changes or T wave abnormalities.  QTc appropriate.  Will  need to rule out thyroid abnormality.  No signs of thyroid disease on physical exam.  Suspect excessive caffeine intake is contributing to palpitations.  Does not seem to bother patient.  Would also consider anxiety so this may be more related to her caffeine intake. - Checking TSH level - RTC 4 weeks to discuss smoking cessation  Orders Placed This Encounter  Procedures  . TSH  . EKG 12-Lead   No orders of the defined types were placed in this encounter.   Harriet Butte, [place], PGY-2 09/04/2017, 3:08 PM

## 2017-09-05 ENCOUNTER — Encounter: Payer: Self-pay | Admitting: Family Medicine

## 2017-09-05 LAB — TSH: TSH: 1.32 u[IU]/mL (ref 0.450–4.500)

## 2017-09-08 ENCOUNTER — Encounter (HOSPITAL_COMMUNITY): Payer: Self-pay | Admitting: Emergency Medicine

## 2017-09-08 ENCOUNTER — Emergency Department (HOSPITAL_COMMUNITY)
Admission: EM | Admit: 2017-09-08 | Discharge: 2017-09-08 | Disposition: A | Discharge status: Home / Self Care | Payer: Self-pay | Attending: Emergency Medicine | Admitting: Emergency Medicine

## 2017-09-08 ENCOUNTER — Emergency Department (HOSPITAL_COMMUNITY): Payer: Self-pay

## 2017-09-08 DIAGNOSIS — R200 Anesthesia of skin: Secondary | ICD-10-CM | POA: Insufficient documentation

## 2017-09-08 DIAGNOSIS — F17210 Nicotine dependence, cigarettes, uncomplicated: Secondary | ICD-10-CM | POA: Insufficient documentation

## 2017-09-08 DIAGNOSIS — E876 Hypokalemia: Secondary | ICD-10-CM | POA: Insufficient documentation

## 2017-09-08 DIAGNOSIS — Z79899 Other long term (current) drug therapy: Secondary | ICD-10-CM | POA: Insufficient documentation

## 2017-09-08 DIAGNOSIS — E860 Dehydration: Secondary | ICD-10-CM | POA: Insufficient documentation

## 2017-09-08 DIAGNOSIS — R112 Nausea with vomiting, unspecified: Secondary | ICD-10-CM | POA: Insufficient documentation

## 2017-09-08 DIAGNOSIS — R42 Dizziness and giddiness: Secondary | ICD-10-CM | POA: Insufficient documentation

## 2017-09-08 DIAGNOSIS — I10 Essential (primary) hypertension: Secondary | ICD-10-CM | POA: Insufficient documentation

## 2017-09-08 LAB — BASIC METABOLIC PANEL
ANION GAP: 7 (ref 5–15)
BUN: 18 mg/dL (ref 6–20)
CO2: 29 mmol/L (ref 22–32)
Calcium: 9.1 mg/dL (ref 8.9–10.3)
Chloride: 106 mmol/L (ref 101–111)
Creatinine, Ser: 0.78 mg/dL (ref 0.44–1.00)
GFR calc Af Amer: >60 mL/min (ref >60)
GFR calc non Af Amer: >60 mL/min (ref >60)
Glucose, Bld: 113 mg/dL — ABNORMAL HIGH (ref 65–99)
Potassium: 2.7 mmol/L — CL (ref 3.5–5.1)
SODIUM: 142 mmol/L (ref 135–145)

## 2017-09-08 LAB — I-STAT TROPONIN, ED
Troponin i, poc: 0.01 ng/mL (ref 0.00–0.08)
Troponin i, poc: 0 ng/mL (ref 0.00–0.08)

## 2017-09-08 LAB — CBC
HEMATOCRIT: 32.4 % — AB (ref 36.0–46.0)
HEMOGLOBIN: 10.2 g/dL — AB (ref 12.0–15.0)
MCH: 28.1 pg (ref 26.0–34.0)
MCHC: 31.5 g/dL (ref 30.0–36.0)
MCV: 89.3 fL (ref 78.0–100.0)
Platelets: 277 10*3/uL (ref 150–400)
RBC: 3.63 MIL/uL — ABNORMAL LOW (ref 3.87–5.11)
RDW: 14.7 % (ref 11.5–15.5)
WBC: 4.4 10*3/uL (ref 4.0–10.5)

## 2017-09-08 LAB — I-STAT BETA HCG BLOOD, ED (MC, WL, AP ONLY)

## 2017-09-08 MED ORDER — SODIUM CHLORIDE 0.9 % IV BOLUS
1000.0000 mL | Freq: Once | INTRAVENOUS | Status: AC
  Administered 2017-09-08: 1000 mL via INTRAVENOUS

## 2017-09-08 MED ORDER — POTASSIUM CHLORIDE CRYS ER 20 MEQ PO TBCR
40.0000 meq | EXTENDED_RELEASE_TABLET | Freq: Once | ORAL | Status: AC
  Administered 2017-09-08: 40 meq via ORAL
  Filled 2017-09-08: qty 2

## 2017-09-08 MED ORDER — POTASSIUM CHLORIDE CRYS ER 20 MEQ PO TBCR: 40.0000 meq | EXTENDED_RELEASE_TABLET | Freq: Every day | ORAL | 0 refills | Status: DC

## 2017-09-08 MED ORDER — POTASSIUM CHLORIDE 10 MEQ/100ML IV SOLN
10.0000 meq | INTRAVENOUS | Status: AC
  Administered 2017-09-08 (×2): 10 meq via INTRAVENOUS
  Filled 2017-09-08 (×2): qty 100

## 2017-09-08 MED ORDER — ONDANSETRON HCL 4 MG/2ML IJ SOLN
4.0000 mg | Freq: Once | INTRAMUSCULAR | Status: AC
  Administered 2017-09-08: 4 mg via INTRAVENOUS
  Filled 2017-09-08: qty 2

## 2017-09-08 MED ORDER — MAGNESIUM CHLORIDE 64 MG PO TBEC
1.0000 | DELAYED_RELEASE_TABLET | Freq: Once | ORAL | Status: AC
  Administered 2017-09-08: 64 mg via ORAL
  Filled 2017-09-08: qty 1

## 2017-09-08 NOTE — ED Notes (Signed)
Pt states " I feel a lot better at this time : pt denies any further n/v at this time

## 2017-09-08 NOTE — ED Provider Notes (Signed)
[facility] EMERGENCY DEPARTMENT Provider Note   CSN: [id number] Arrival date & time: 09/08/17  0414     History   Chief Complaint Chief Complaint  Patient presents with  . Chest Pain  . Dizziness    HPI Nicole Barrera is a 57 y.o. female hx of HTN, anemia, here presenting with numbness, palpitations.  Patient states that he has been having palpitations for the last several days.  Last night, patient started getting lightheaded and dizzy.  States that it is worse when she stands up.  She states that she felt like she is on pass out.  Also has some palpitations when she stands up.  Denies trouble speaking or weakness or chest pain.  Patient states that she has been compliant with her medicines.  Has some nausea and vomiting as well but denies any abdominal pain or fevers.   The history is provided by the patient.    Past Medical History:  Diagnosis Date  . ANEMIA-IRON DEFICIENCY 02/27/2009   Qualifier: Diagnosis of  By: Jorene Minors, Scott    . HTN (hypertension)   . PEPTIC ULCER DISEASE 11/19/2006   Qualifier: Diagnosis of  By: Radene Ou MD, [place]    . Tobacco abuse     Patient Active Problem List   Diagnosis Date Noted  . Venous stasis dermatitis of both lower extremities 02/05/2015  . De Quervain's tenosynovitis 07/24/2014  . Pain, dental 03/02/2013  . Trigger thumb of left hand 11/18/2012  . Palpitations 01/17/2009  . Tobacco use disorder 07/28/2008  . Primary hypertension 11/19/2006  . Systolic murmur 09/28/1599    History reviewed. No pertinent surgical history.   OB History   None      Home Medications    Prior to Admission medications   Medication Sig Start Date End Date Taking? Authorizing Provider  amLODipine (NORVASC) 10 MG tablet Take 1 tablet (10 mg total) by mouth daily. 06/01/17   [place]ie Bing, DO  hydrochlorothiazide (HYDRODIURIL) 25 MG tablet Take 1 tablet (25 mg total) by mouth daily. 08/07/17   [place] Bing, DO     Family History Family History  Problem Relation Age of Onset  . Breast cancer Mother   . Breast cancer Sister     Social History Social History   Tobacco Use  . Smoking status: Current Every Day Smoker    Packs/day: 0.50    Types: Cigarettes  . Smokeless tobacco: Never Used  Substance Use Topics  . Alcohol use: No  . Drug use: No     Allergies   Lisinopril   Review of Systems Review of Systems  Gastrointestinal: Positive for vomiting.  Neurological: Positive for dizziness.  All other systems reviewed and are negative.    Physical Exam Updated Vital Signs BP 134/81 (BP Location: Right Arm)   Pulse 68   Temp 98.1 F (36.7 C) (Oral)   Resp (!) 22   Ht 5\' 4"  (1.626 m)   Wt 59 kg (130 lb)   SpO2 100%   BMI 22.31 kg/m   Physical Exam  Constitutional: She is oriented to person, place, and time.  Slightly dehydrated   HENT:  Head: Normocephalic.  MM slightly dry   Eyes: Pupils are equal, round, and reactive to light.  Neck: Normal range of motion. Neck supple.  Cardiovascular: Regular rhythm and normal pulses.  Pulmonary/Chest: Effort normal and breath sounds normal.  Abdominal: Soft. Bowel sounds are normal.  Musculoskeletal: Normal range of motion.  Right lower leg: Normal.       Left lower leg: Normal.  Neurological: She is alert and oriented to person, place, and time.  Skin: Skin is warm.  Psychiatric: She has a normal mood and affect. Her behavior is normal.  Nursing note and vitals reviewed.    ED Treatments / Results  Labs (all labs ordered are listed, but only abnormal results are displayed) Labs Reviewed  BASIC METABOLIC PANEL - Abnormal; Notable for the following components:      Result Value   Potassium 2.7 (*)    Glucose, Bld 113 (*)    All other components within normal limits  CBC - Abnormal; Notable for the following components:   RBC 3.63 (*)    Hemoglobin 10.2 (*)    HCT 32.4 (*)    All other components within normal  limits  I-STAT TROPONIN, ED  I-STAT BETA HCG BLOOD, ED ([facility], [facility], [facility] ONLY)  I-STAT TROPONIN, ED    EKG EKG Interpretation  Date/Time:  Tuesday September 08 2017 04:33:03 EDT Ventricular Rate:  64 PR Interval:  198 QRS Duration: 82 QT Interval:  404 QTC Calculation: 416 R Axis:   85 Text Interpretation:  Normal sinus rhythm Normal ECG No significant change since last tracing Confirmed by Wandra Arthurs [phone]) on 09/08/2017 7:58:45 AM   Radiology Dg Chest 2 View  Result Date: 09/08/2017 CLINICAL DATA:  Chest pain.  Dizziness and vomiting. EXAM: CHEST - 2 VIEW COMPARISON:  Radiograph 06/09/2014 FINDINGS: The cardiomediastinal contours are normal. The lungs are clear. Pulmonary vasculature is normal. No consolidation, pleural effusion, or pneumothorax. No acute osseous abnormalities are seen. IMPRESSION: Unremarkable radiographs of the chest. Electronically Signed   By: Jeb Levering M.D.   On: 09/08/2017 05:53    Procedures Procedures (including critical care time)  Medications Ordered in ED Medications  potassium chloride 10 mEq in 100 mL IVPB (has no administration in time range)  magnesium chloride (SLOW-MAG) 64 MG SR tablet 64 mg (has no administration in time range)  sodium chloride 0.9 % bolus 1,000 mL (1,000 mLs Intravenous New Bag/Given 09/08/17 0833)  potassium chloride SA (K-DUR,KLOR-CON) CR tablet 40 mEq (40 mEq Oral Given 09/08/17 0837)  ondansetron (ZOFRAN) injection 4 mg (4 mg Intravenous Given 09/08/17 0300)     Initial Impression / Assessment and Plan / ED Course  I have reviewed the triage vital signs and the nursing notes.  Pertinent labs & imaging results that were available during my care of the patient were reviewed by me and considered in my medical decision making (see chart for details).     Nicole Barrera is a 57 y.o. female here with dizziness, palpitations, vomiting. Likely mild gastro with some dehydration. Neuro exam unremarkable, abdomen nontender.  Will get labs, orthostatics, hydrate patient.   8:45 AM  K 2.7 likely combination of taking her HCTZ and dehydration. Ordered IV and PO potassium, magnesium. Will continue to hydrate.   12:49 PM Finished potassium 10 meq x 2 and PO 40 meq. Tolerated PO in the ED. Dizziness improved with IVF. Will dc home with Kdur 40 meq daily. Recommend recheck BMP in the office next week.    Final Clinical Impressions(s) / ED Diagnoses   Final diagnoses:  None    ED Discharge Orders    None       Drenda Freeze, MD 09/08/17 1250

## 2017-09-08 NOTE — ED Triage Notes (Signed)
Dr. Wyvonnia Dusky aware of K 2.7

## 2017-09-08 NOTE — ED Triage Notes (Signed)
BIB EMS from home reports CP, dizziness since Wednesday. Pt woke up this AM and had 1 episode of emesis and diarrhea. Pt saw her PCP for same. VSS.

## 2017-09-08 NOTE — Discharge Instructions (Signed)
Your potassium is low and that is likely causing her dizziness.   Take potassium 40 meq daily for the next week.   You need to see your doctor in a week to recheck your potassium level.   Eat more foods that has high potassium   Return to ER if you have worse dizziness, passing out, chest pain, weakness, numbness

## 2017-09-09 ENCOUNTER — Other Ambulatory Visit: Payer: Self-pay

## 2017-09-09 ENCOUNTER — Other Ambulatory Visit: Payer: Self-pay | Admitting: Family Medicine

## 2017-09-09 DIAGNOSIS — E876 Hypokalemia: Secondary | ICD-10-CM

## 2017-09-09 MED ORDER — POTASSIUM CHLORIDE CRYS ER 20 MEQ PO TBCR: 40.0000 meq | EXTENDED_RELEASE_TABLET | Freq: Every day | ORAL | 0 refills | Status: DC

## 2017-09-09 MED ORDER — POTASSIUM CHLORIDE ER 20 MEQ PO TBCR: 40.0000 meq | EXTENDED_RELEASE_TABLET | Freq: Every day | ORAL | 0 refills | Status: DC

## 2017-09-09 NOTE — Telephone Encounter (Signed)
Pt called nurse line stating she was seen in ED for low potassium and was given a paper prescription. Pt states she has lost the paper rx and needs a new script sent to he pharmacy. Please advise.

## 2017-09-28 ENCOUNTER — Encounter: Payer: Self-pay | Admitting: Family Medicine

## 2017-09-28 ENCOUNTER — Ambulatory Visit (INDEPENDENT_AMBULATORY_CARE_PROVIDER_SITE_OTHER): Payer: Self-pay | Admitting: Family Medicine

## 2017-09-28 VITALS — BP 100/70 | HR 70 | Temp 98.5°F | Ht 64.0 in | Wt 127.2 lb

## 2017-09-28 DIAGNOSIS — I10 Essential (primary) hypertension: Secondary | ICD-10-CM

## 2017-09-28 DIAGNOSIS — E876 Hypokalemia: Secondary | ICD-10-CM | POA: Insufficient documentation

## 2017-09-28 DIAGNOSIS — R002 Palpitations: Secondary | ICD-10-CM

## 2017-09-28 DIAGNOSIS — F17200 Nicotine dependence, unspecified, uncomplicated: Secondary | ICD-10-CM

## 2017-09-28 NOTE — Assessment & Plan Note (Signed)
Subacute.  Resolved.  Likely related to excessive caffeine intake. - Congratulated patient and encouraged her to continue restricting caffeine intake

## 2017-09-28 NOTE — Assessment & Plan Note (Signed)
Acute.  Incidental finding during ED visit for dizziness.  Does have resolved palpitations thought to be related to caffeine intake. - Checking BMET

## 2017-09-28 NOTE — Patient Instructions (Signed)
Thank you for coming in to see [place] today. Please see below to review our plan for today's visit.  I am pleased to hear your palpitations have resolved and you have successfully limited sodas from your diet.  Continue drinking caffeine free to prevent you from having palpitations.  I will call you if your lab results are abnormal otherwise expect to receive results in the mail.  Let me know at any point if you are interested in smoking cessation.  Please call the clinic at [phone] if your symptoms worsen or you have any concerns. It was our pleasure to serve you.  Harriet Butte, [place], PGY-2

## 2017-09-28 NOTE — Progress Notes (Signed)
   Subjective   Patient ID: Nicole Barrera    DOB: 06-05-1960, 57 y.o. female   MRN: [id number]  CC: "Potassium check"  HPI: Nicole Barrera is a 57 y.o. female who presents to clinic today for the following:  Hypokalemia: Patient completed 5-day course of potassium supplement following sinus work-up on 09/08/2017 with incidental finding of potassium level 2.7 without EKG changes.  Patient has increased potassium rich foods since completing supplements.  Palpitations: Here today for follow-up.  Patient states symptoms have resolved since cutting out all caffeinated beverages.  She now drinks caffeine free sodas.  Symptoms of palpitations, chest pain, shortness of breath, diaphoresis, fatigue or syncope.  Hypertension: Chronic use of HCTZ and amlodipine with good adherence.  Current everyday smoker.  Smoking: Current everyday smoker.  Smokes half pack per day.  Has been smoking since age 24.  No prior attempts to quit.  Not interested today.  ROS: see HPI for pertinent.  [place]: HTN, venous stasis, back to use disorder, systolic murmur, trigger finger.  Surgical history unremarkable.  Family history breast cancer (mother and sister).  Smoking status reviewed. Medications reviewed.  Objective   BP 100/70 (BP Location: Left Arm, Patient Position: Sitting, Cuff Size: Normal)   Pulse 70   Temp 98.5 F (36.9 C) (Oral)   Ht 5\' 4"  (1.626 m)   Wt 127 lb 3.2 oz (57.7 kg)   SpO2 100%   BMI 21.83 kg/m  Vitals and nursing note reviewed.  General: well nourished, well developed, NAD with non-toxic appearance HEENT: normocephalic, atraumatic, moist mucous membranes Neck: supple, non-tender without lymphadenopathy Cardiovascular: regular rate and rhythm without murmurs, rubs, or gallops Lungs: clear to auscultation bilaterally with normal work of breathing Abdomen: soft, non-tender, non-distended, normoactive bowel sounds Skin: warm, dry, no rashes or lesions, cap refill < 2  seconds Extremities: warm and well perfused, normal tone, no edema  Assessment & Plan   Palpitations Subacute.  Resolved.  Likely related to excessive caffeine intake. - Congratulated patient and encouraged her to continue restricting caffeine intake  Primary hypertension Chronic.  Controlled.  Everyday smoker. - Continue amlodipine 10 mg daily and HCTZ 25 mg daily - Discussed smoking cessation  Tobacco use disorder Chronic.  Has 20-pack-year history.  Not interested in cessation. - Given information for 1 800 quit now and advised to contact clinic when ready for cessation  Hypokalemia Acute.  Incidental finding during ED visit for dizziness.  Does have resolved palpitations thought to be related to caffeine intake. - Checking BMET  Orders Placed This Encounter  Procedures  . Basic metabolic panel   No orders of the defined types were placed in this encounter.   Harriet Butte, [place], PGY-2 09/28/2017, 5:47 PM

## 2017-09-28 NOTE — Assessment & Plan Note (Signed)
Chronic.  Controlled.  Everyday smoker. - Continue amlodipine 10 mg daily and HCTZ 25 mg daily - Discussed smoking cessation

## 2017-09-28 NOTE — Assessment & Plan Note (Signed)
Chronic.  Has 20-pack-year history.  Not interested in cessation. - Given information for 1 800 quit now and advised to contact clinic when ready for cessation

## 2017-09-29 LAB — BASIC METABOLIC PANEL
BUN/Creatinine Ratio: 14 (ref 9–23)
BUN: 10 mg/dL (ref 6–24)
CO2: 27 mmol/L (ref 20–29)
Calcium: 9 mg/dL (ref 8.7–10.2)
Chloride: 100 mmol/L (ref 96–106)
Creatinine, Ser: 0.73 mg/dL (ref 0.57–1.00)
GFR calc non Af Amer: 92 mL/min/{1.73_m2} (ref >59)
GFR, EST AFRICAN AMERICAN: 106 mL/min/{1.73_m2} (ref >59)
Glucose: 91 mg/dL (ref 65–99)
POTASSIUM: 3.7 mmol/L (ref 3.5–5.2)
Sodium: 144 mmol/L (ref 134–144)

## 2017-12-31 ENCOUNTER — Ambulatory Visit (HOSPITAL_COMMUNITY): Payer: Self-pay

## 2018-01-01 ENCOUNTER — Telehealth (HOSPITAL_COMMUNITY): Payer: Self-pay

## 2018-03-03 ENCOUNTER — Other Ambulatory Visit: Payer: Self-pay | Admitting: Family Medicine

## 2018-03-03 DIAGNOSIS — I10 Essential (primary) hypertension: Secondary | ICD-10-CM

## 2018-03-09 ENCOUNTER — Encounter (HOSPITAL_COMMUNITY): Payer: Self-pay

## 2018-03-09 ENCOUNTER — Ambulatory Visit (HOSPITAL_COMMUNITY)
Admission: RE | Admit: 2018-03-09 | Discharge: 2018-03-09 | Disposition: A | Discharge status: Home / Self Care | Payer: Self-pay | Source: Ambulatory Visit | Attending: Obstetrics and Gynecology | Admitting: Obstetrics and Gynecology

## 2018-03-09 ENCOUNTER — Ambulatory Visit
Admission: RE | Admit: 2018-03-09 | Discharge: 2018-03-09 | Disposition: A | Discharge status: Home / Self Care | Payer: No Typology Code available for payment source | Source: Ambulatory Visit | Attending: Obstetrics and Gynecology | Admitting: Obstetrics and Gynecology

## 2018-03-09 VITALS — BP 110/70 | Ht 64.0 in | Wt 133.0 lb

## 2018-03-09 DIAGNOSIS — Z01419 Encounter for gynecological examination (general) (routine) without abnormal findings: Secondary | ICD-10-CM

## 2018-03-09 DIAGNOSIS — Z1231 Encounter for screening mammogram for malignant neoplasm of breast: Secondary | ICD-10-CM

## 2018-03-09 NOTE — Addendum Note (Signed)
Encounter addended by: Loletta Parish, RN on: 03/09/2018 3:30 PM  Actions taken: Sign clinical note

## 2018-03-09 NOTE — Patient Instructions (Addendum)
Explained breast self awareness [name]. Let patient know that if today's Pap smear is normal that her next Pap smear will be due in one year due to her last Pap smear being abnormal. Referred patient to the [facility] for a screening mammogram. Appointment scheduled for Tuesday, March 09, 2018 at 1600. Patient aware of appointment and will be there. Let patient know will follow up with her within the next couple weeks with results of Pap smear by letter or phone. Informed patient that the [facility] will follow-up with her within the next couple of weeks with results of mammogram by letter or phone. Discussed smoking cessation with patient. Referred patient to the [facility] Quitline and gave resources to the free smoking cessation classes at [facility]. Nicole Barrera verbalized understanding.  Aubery Date, Arvil Chaco, RN 3:15 PM

## 2018-03-09 NOTE — Progress Notes (Signed)
No complaints today.   Pap Smear: Pap smear completed today. Last Pap smear was 02/13/2010 at [facility] on [facility] and [facility]. Patient was not aware that her last Pap smear was abnormal and no follow-up was completed. Per patient her last Pap smear is the only abnormal Pap smear she has had. Last three Pap smear results are in Epic.  Physical exam: Breasts Breasts symmetrical. No skin abnormalities bilateral breasts. No nipple retraction bilateral breasts. No nipple discharge bilateral breasts. No lymphadenopathy. No lumps palpated bilateral breasts. No complaints of pain or tenderness on exam. Referred patient to the [facility] for a screening mammogram. Appointment scheduled for Tuesday, March 09, 2018 at 1600.        Pelvic/Bimanual   Ext Genitalia No lesions, no swelling and no discharge observed on external genitalia.         Vagina Vagina pink and normal texture. No lesions or discharge observed in vagina.          Cervix Cervix is present. Cervix pink and of normal texture. No discharge observed.     Uterus Uterus is present and palpable. Uterus in normal position and normal size.        Adnexae Bilateral ovaries present and palpable. No tenderness on palpation.         Rectovaginal No rectal exam completed today since patient had no rectal complaints. No skin abnormalities observed on exam.    Smoking History: Patient is a current smoker. Discussed smoking cessation with patient. Referred patient to the [facility] Quitline and gave resources to the free smoking cessation classes at [facility].  Patient Navigation: Patient education provided. Access to services provided for patient through [place] program.   Colorectal Cancer Screening: Per patient has never had a colonoscopy completed. No complaints today. FIT Test given to patient to complete and return to [facility].  Breast and Cervical Cancer Risk Assessment: Patient has a family history of her mother and  sister having breast cancer. Patient has no known genetic mutations or history of radiation treatment to the chest before age 68. Patient has no history of cervical dysplasia, immunocompromised, or DES exposure in-utero.  Risk Assessment    Risk Scores      03/09/2018   Last edited by: Loletta Parish, RN   5-year risk: 3.6 %   Lifetime risk: 18 %

## 2018-03-10 ENCOUNTER — Encounter (HOSPITAL_COMMUNITY): Payer: Self-pay | Admitting: *Deleted

## 2018-03-15 LAB — CYTOLOGY - PAP
Diagnosis: NEGATIVE
HPV (WINDOPATH): NOT DETECTED

## 2018-03-22 ENCOUNTER — Ambulatory Visit: Payer: No Typology Code available for payment source | Admitting: Family Medicine

## 2018-03-26 ENCOUNTER — Other Ambulatory Visit: Payer: Self-pay

## 2018-03-30 ENCOUNTER — Telehealth (HOSPITAL_COMMUNITY): Payer: Self-pay | Admitting: *Deleted

## 2018-03-30 LAB — FECAL OCCULT BLOOD, IMMUNOCHEMICAL: Fecal Occult Bld: POSITIVE — AB

## 2018-03-30 LAB — SPECIMEN STATUS REPORT

## 2018-03-30 NOTE — Telephone Encounter (Signed)
Telephoned patient and left a voice mail , to return a call to [facility], regarding stool card results and to set up an appointment with [facility].

## 2018-04-05 ENCOUNTER — Ambulatory Visit (HOSPITAL_COMMUNITY)
Admission: RE | Admit: 2018-04-05 | Discharge: 2018-04-05 | Disposition: A | Discharge status: Home / Self Care | Payer: No Typology Code available for payment source | Source: Ambulatory Visit | Attending: Family Medicine | Admitting: Family Medicine

## 2018-04-05 ENCOUNTER — Ambulatory Visit (INDEPENDENT_AMBULATORY_CARE_PROVIDER_SITE_OTHER): Payer: Self-pay | Admitting: Family Medicine

## 2018-04-05 ENCOUNTER — Other Ambulatory Visit: Payer: Self-pay

## 2018-04-05 VITALS — BP 142/72 | HR 61 | Temp 98.6°F | Wt 135.2 lb

## 2018-04-05 DIAGNOSIS — D649 Anemia, unspecified: Secondary | ICD-10-CM | POA: Insufficient documentation

## 2018-04-05 DIAGNOSIS — I10 Essential (primary) hypertension: Secondary | ICD-10-CM | POA: Insufficient documentation

## 2018-04-05 DIAGNOSIS — R002 Palpitations: Secondary | ICD-10-CM | POA: Insufficient documentation

## 2018-04-05 DIAGNOSIS — E876 Hypokalemia: Secondary | ICD-10-CM

## 2018-04-05 NOTE — Progress Notes (Signed)
  Subjective:    Patient ID: Nicole Barrera, female    DOB: [birth year], 58 y.o.   MRN: [id number]   CC: Palpitations, concern for hyperkalemia  HPI:  Palpitations Patient reports that she has been having some flutters in her chest that started 2 days ago.  Patient reports that previously when she had this she was hypokalemic and had to be admitted to the hospital due to low potassium as well as dehydration.  Patient does not want to get to the point where she has to be admitted to the hospital. ROS: Denies chest pain, shortness of breath, diaphoresis, fatigue  Hypertension Patient with initial blood pressure of 160/80 but does report that she has not taken her Norvasc today as she ran out of her prescription.  Her prescription is now waiting for her at the pharmacy and she will take this when she gets home. ROS: Denies leg swelling, vision changes, headache Repeat blood pressure improved at 142/72  Smoking status reviewed  ROS: 10 point ROS is otherwise negative, except as mentioned in HPI  Patient Active Problem List   Diagnosis Date Noted  . Hypokalemia 09/28/2017  . Venous stasis dermatitis of both lower extremities 02/05/2015  . De Quervain's tenosynovitis 07/24/2014  . Pain, dental 03/02/2013  . Trigger thumb of left hand 11/18/2012  . Palpitations 01/17/2009  . Tobacco use disorder 07/28/2008  . Primary hypertension 11/19/2006  . Systolic murmur 28/78/6767     Objective:  BP (!) 142/72   Pulse 61   Temp 98.6 F (37 C)   Wt 135 lb 3.2 oz (61.3 kg)   SpO2 99%   BMI 23.21 kg/m  Vitals and nursing note reviewed  General: NAD, pleasant Cardiac: RRR, normal heart sounds, no murmurs Respiratory: CTAB, normal effort Extremities: no edema or cyanosis. WWP. Skin: warm and dry, no rashes noted Neuro: alert and oriented, no focal deficits Psych: normal affect  EKG with NSR and no significant change from previous Assessment & Plan:    Primary hypertension Patient  requesting lipid panel and has not had one since 2015, will obtain as we are already getting labs.  Patient repeat blood pressure improved to 142/72.  Patient is to go pick up her Norvasc as soon as she leaves the office as it is likely elevated due to not taking her medication today. Continue current medications  Palpitations Patient here today as she believes that she is low potassium because she has been having some palpitations.  EKG shows normal sinus rhythm with no significant change from previous.  Patient denies any chest pain, shortness of breath, or any red flag symptoms at this time.  Will obtain BMP and CBC to determine if patient is hypokalemic or anemic as she was previously.  Patient does not appear dehydrated on exam.  Patient may require referral to cardiology if she continues to have palpitations without other cause.  [place] Meggie Laseter, DO Family Medicine Resident PGY-2

## 2018-04-05 NOTE — Patient Instructions (Addendum)
Thank you for coming to see me today. It was a pleasure! Today we talked about:   Your palpitations.  We will check your potassium level.  Please be sure to take your blood pressure medication after you pick it up from the pharmacy.  We will call you with your lab results within 48 hours.  If you develop chest pain, shortness of breath or getting tired when wlaking, please call or go to the ED.   Please follow-up with your regular doctor as needed.  If you have any questions or concerns, please do not hesitate to call the office at [phone].  Take Care,   [place] Tristian Bouska, DO

## 2018-04-05 NOTE — Assessment & Plan Note (Signed)
Patient here today as she believes that she is low potassium because she has been having some palpitations.  EKG shows normal sinus rhythm with no significant change from previous.  Patient denies any chest pain, shortness of breath, or any red flag symptoms at this time.  Will obtain BMP and CBC to determine if patient is hypokalemic or anemic as she was previously.  Patient does not appear dehydrated on exam.  Patient may require referral to cardiology if she continues to have palpitations without other cause.

## 2018-04-05 NOTE — Assessment & Plan Note (Signed)
Patient requesting lipid panel and has not had one since 2015, will obtain as we are already getting labs.  Patient repeat blood pressure improved to 142/72.  Patient is to go pick up her Norvasc as soon as she leaves the office as it is likely elevated due to not taking her medication today. Continue current medications

## 2018-04-06 LAB — LIPID PANEL
CHOLESTEROL TOTAL: 106 mg/dL (ref 100–199)
Chol/HDL Ratio: 2.9 ratio (ref 0.0–4.4)
HDL: 37 mg/dL — ABNORMAL LOW (ref >39)
LDL Calculated: 52 mg/dL (ref 0–99)
Triglycerides: 85 mg/dL (ref 0–149)
VLDL CHOLESTEROL CAL: 17 mg/dL (ref 5–40)

## 2018-04-06 LAB — BASIC METABOLIC PANEL
BUN/Creatinine Ratio: 19 (ref 9–23)
BUN: 14 mg/dL (ref 6–24)
CO2: 27 mmol/L (ref 20–29)
CREATININE: 0.72 mg/dL (ref 0.57–1.00)
Calcium: 9 mg/dL (ref 8.7–10.2)
Chloride: 105 mmol/L (ref 96–106)
GFR, EST AFRICAN AMERICAN: 108 mL/min/{1.73_m2} (ref >59)
GFR, EST NON AFRICAN AMERICAN: 93 mL/min/{1.73_m2} (ref >59)
Glucose: 69 mg/dL (ref 65–99)
POTASSIUM: 3.6 mmol/L (ref 3.5–5.2)
Sodium: 141 mmol/L (ref 134–144)

## 2018-04-06 LAB — CBC
HEMOGLOBIN: 9.7 g/dL — AB (ref 11.1–15.9)
Hematocrit: 28.7 % — ABNORMAL LOW (ref 34.0–46.6)
MCH: 29.1 pg (ref 26.6–33.0)
MCHC: 33.8 g/dL (ref 31.5–35.7)
MCV: 86 fL (ref 79–97)
Platelets: 314 10*3/uL (ref 150–450)
RBC: 3.33 x10E6/uL — AB (ref 3.77–5.28)
RDW: 13.9 % (ref 11.7–15.4)
WBC: 3.9 10*3/uL (ref 3.4–10.8)

## 2018-04-08 ENCOUNTER — Other Ambulatory Visit: Payer: Self-pay | Admitting: Family Medicine

## 2018-04-08 MED ORDER — FERROUS FUMARATE 324 (106 FE) MG PO TABS: 1.0000 | ORAL_TABLET | ORAL | 2 refills | Status: DC

## 2018-04-08 NOTE — Progress Notes (Signed)
Called and discussed patient's anemia, which appears chronic. Patient asymptomatic. Will start every other day iron supplementation.   Nicole Shanena Pellegrino, DO PGY-2, [facility]

## 2018-04-09 ENCOUNTER — Encounter: Payer: Self-pay | Admitting: Family Medicine

## 2018-04-13 ENCOUNTER — Encounter (HOSPITAL_COMMUNITY): Payer: Self-pay | Admitting: *Deleted

## 2018-04-13 NOTE — Progress Notes (Signed)
Letter mailed to patient with negative pap smear results. HPV was negative. Next pap smear due in one year due to history of abnormal pap smears.  

## 2018-04-15 ENCOUNTER — Other Ambulatory Visit (HOSPITAL_COMMUNITY): Payer: Self-pay | Admitting: *Deleted

## 2018-04-15 DIAGNOSIS — Z01419 Encounter for gynecological examination (general) (routine) without abnormal findings: Secondary | ICD-10-CM

## 2018-04-22 ENCOUNTER — Ambulatory Visit: Payer: Self-pay | Admitting: Family Medicine

## 2018-04-22 NOTE — Addendum Note (Signed)
Addended by: Unice Bailey B on: 04/22/2018 10:36 AM   Modules accepted: Orders

## 2018-04-22 NOTE — Progress Notes (Deleted)
   Subjective   Patient ID: Nicole Barrera    DOB: 21-Aug-1960, 58 y.o. female   MRN: [id number]  CC: "***"  HPI: Nicole Barrera is a 58 y.o. female who presents to clinic today for the following:  ***: ***  ROS: see HPI for pertinent.  [place]: HTN,venous stasis,back to use disorder, systolic murmur, trigger finger.Surgical history unremarkable. Family history breast cancer (mother and sister). Smoking status reviewed. Medications reviewed.  Objective   There were no vitals taken for this visit. Vitals and nursing note reviewed.  General: well nourished, well developed, NAD with non-toxic appearance HEENT: normocephalic, atraumatic, moist mucous membranes Neck: supple, non-tender without lymphadenopathy Cardiovascular: regular rate and rhythm without murmurs, rubs, or gallops Lungs: clear to auscultation bilaterally with normal work of breathing Abdomen: soft, non-tender, non-distended, normoactive bowel sounds Skin: warm, dry, no rashes or lesions, cap refill < 2 seconds Extremities: warm and well perfused, normal tone, no edema  Assessment & Plan   No problem-specific Assessment & Plan notes found for this encounter.  No orders of the defined types were placed in this encounter.  No orders of the defined types were placed in this encounter.   Harriet Butte, [place], PGY-3 04/22/2018, 8:17 AM

## 2018-04-23 ENCOUNTER — Inpatient Hospital Stay: Payer: No Typology Code available for payment source | Attending: Family Medicine

## 2018-04-23 ENCOUNTER — Ambulatory Visit: Payer: No Typology Code available for payment source

## 2018-05-17 ENCOUNTER — Encounter (HOSPITAL_COMMUNITY): Payer: Self-pay | Admitting: *Deleted

## 2018-05-17 NOTE — Progress Notes (Signed)
Letter mailed to patient to patient concerning positive Fit Test results. Advised patient would need to follow up with primary care physician to go over these results.

## 2018-05-19 ENCOUNTER — Ambulatory Visit: Payer: Self-pay | Admitting: Family Medicine

## 2018-05-19 NOTE — Progress Notes (Deleted)
   Subjective   Patient ID: Nicole Barrera    DOB: Jun 04, 1960, 58 y.o. female   MRN: [id number]  CC: "***"  HPI: Nicole Barrera is a 58 y.o. female who presents to clinic today for the following:  ***: ***  ROS: see HPI for pertinent.  [place]: HTN,venous stasis,back to use disorder, systolic murmur, trigger finger.Surgical history unremarkable. Family history breast cancer (mother and sister). Smoking status reviewed. Medications reviewed.  Objective   There were no vitals taken for this visit. Vitals and nursing note reviewed.  General: well nourished, well developed, NAD with non-toxic appearance HEENT: normocephalic, atraumatic, moist mucous membranes Neck: supple, non-tender without lymphadenopathy Cardiovascular: regular rate and rhythm without murmurs, rubs, or gallops Lungs: clear to auscultation bilaterally with normal work of breathing Abdomen: soft, non-tender, non-distended, normoactive bowel sounds Skin: warm, dry, no rashes or lesions, cap refill < 2 seconds Extremities: warm and well perfused, normal tone, no edema  Assessment & Plan   No problem-specific Assessment & Plan notes found for this encounter.  No orders of the defined types were placed in this encounter.  No orders of the defined types were placed in this encounter.   Harriet Butte, [place], PGY-3 05/19/2018, 8:08 AM

## 2018-06-02 ENCOUNTER — Encounter: Payer: Self-pay | Admitting: Family Medicine

## 2018-06-02 ENCOUNTER — Ambulatory Visit (INDEPENDENT_AMBULATORY_CARE_PROVIDER_SITE_OTHER): Payer: Self-pay | Admitting: Family Medicine

## 2018-06-02 ENCOUNTER — Other Ambulatory Visit: Payer: Self-pay

## 2018-06-02 VITALS — BP 128/80 | HR 69 | Temp 98.3°F | Ht 64.0 in | Wt 131.2 lb

## 2018-06-02 DIAGNOSIS — D649 Anemia, unspecified: Secondary | ICD-10-CM | POA: Insufficient documentation

## 2018-06-02 DIAGNOSIS — R195 Other fecal abnormalities: Secondary | ICD-10-CM | POA: Insufficient documentation

## 2018-06-02 DIAGNOSIS — F17200 Nicotine dependence, unspecified, uncomplicated: Secondary | ICD-10-CM

## 2018-06-02 NOTE — Assessment & Plan Note (Signed)
Continues to smoke 0.5 PPD.  Not interested in cessation today. - Discussed importance of smoking cessation

## 2018-06-02 NOTE — Assessment & Plan Note (Signed)
Chronic.  Patient reports history of iron deficiency anemia, however prior labs approximately 1 decade ago do not support this. - Checking anemia panel - Advised to continue iron supplementation at this time - See plan for positive FIT

## 2018-06-02 NOTE — Assessment & Plan Note (Addendum)
Incidental finding on screening.  Asymptomatic.  No prior history of rectal bleeding. - Ambulatory referral to GI for diagnostic colonoscopy - Reviewed return precautions, RTC 3 months or sooner if needed

## 2018-06-02 NOTE — Patient Instructions (Addendum)
Thank you for coming in to see [place] today. Please see below to review our plan for today's visit.  1.  We will check your iron studies today.  I will call you if there is anything abnormal, otherwise you should receive results in the mail.   2.  Continue taking your iron daily and gradually increase by 1 tablet every 2 weeks with a goal of 3 tablets daily.  You can take this with orange juice to help with absorption.  Avoid any dairy products while taking the medication between an hour of taking the iron. 3.  We will schedule an appointment for you to see a gastroenterologist to schedule the diagnostic colonoscopy.  Their office will reach out to you.  You do have numbers to the offices.  4.  Let me know when you are ready to quit smoking.  We will see you again in 3 months.  Please call the clinic at [phone] if your symptoms worsen or you have any concerns. It was our pleasure to serve you.  Harriet Butte, [place], PGY-3

## 2018-06-02 NOTE — Progress Notes (Signed)
   Subjective   Patient ID: Nicole Barrera    DOB: Jul 01, 1960, 58 y.o. female   MRN: [id number]  CC: "FIT test follow-up"  HPI: Nicole Barrera is a 58 y.o. female who presents to clinic today for the following:  Positive FIT test: Nicole Barrera was seen at breast center back in December 2019 and did receive a FIT test for colorectal cancer screening.  She has no prior history of screening.  Test was positive.  She does report a history of constipation with on average of 2 bowel movements weekly, however this is been her baseline for many years.  She currently is on iron for mentation.  Patient denies symptoms of fatigue, unexplained weight change, shortness of breath, chest pain, syncope, abdominal pain, diarrhea, melena or hematochezia.  She is not on blood thinners.  Normocytic anemia: Currently taking iron for "iron deficiency anemia.  Recent blood work in January significant for baseline hemoglobin of 9.7, MCV 86, platelets 314.  Her last iron studies were back in 2010 with a normal folate andlow  iron of 36, normal TIBC 294, and a low ferritin 11.  She is currently taking iron tablets 324 mg daily but has not increased her dose and has been taking it every other day in the past.  Tobacco use disorder: Patient currently smokes 10 cigarettes/day.  She is not interested in cessation today.  She denies cough, unexplained weight change, chest pain, shortness of breath.  ROS: see HPI for pertinent.  [place]: HTN,venous stasis,back to use disorder, systolic murmur, trigger finger.Surgical history unremarkable. Family history breast cancer (mother and sister). Smoking status reviewed. Medications reviewed.  Objective   BP 128/80   Pulse 69   Temp 98.3 F (36.8 C) (Oral)   Ht 5\' 4"  (1.626 m)   Wt 131 lb 3.2 oz (59.5 kg)   SpO2 96%   BMI 22.52 kg/m  Vitals and nursing note reviewed.  General: well nourished, well developed, NAD with non-toxic appearance HEENT: normocephalic, atraumatic,  moist mucous membranes Cardiovascular: regular rate and rhythm without murmurs, rubs, or gallops Lungs: clear to auscultation bilaterally with normal work of breathing Abdomen: soft, non-tender, non-distended, normoactive bowel sounds Skin: warm, dry, no rashes or lesions, cap refill < 2 seconds Extremities: warm and well perfused, normal tone, no edema  Assessment & Plan   Tobacco use disorder Continues to smoke 0.5 PPD.  Not interested in cessation today. - Discussed importance of smoking cessation  Normocytic anemia Chronic.  Patient reports history of iron deficiency anemia, however prior labs approximately 1 decade ago do not support this. - Checking anemia panel - Advised to continue iron supplementation at this time - See plan for positive FIT  Positive FIT (fecal immunochemical test) Incidental finding on screening.  Asymptomatic.  No prior history of rectal bleeding. - Ambulatory referral to GI for diagnostic colonoscopy - Reviewed return precautions, RTC 3 months or sooner if needed  Orders Placed This Encounter  Procedures  . Anemia panel   No orders of the defined types were placed in this encounter.   Harriet Butte, [place], PGY-3 06/02/2018, 1:56 PM

## 2018-06-03 ENCOUNTER — Other Ambulatory Visit: Payer: Self-pay | Admitting: Family Medicine

## 2018-06-03 ENCOUNTER — Telehealth: Payer: Self-pay | Admitting: Family Medicine

## 2018-06-03 DIAGNOSIS — E538 Deficiency of other specified B group vitamins: Secondary | ICD-10-CM

## 2018-06-03 MED ORDER — VITAMIN B-12 1000 MCG PO TABS: ORAL_TABLET | ORAL | 3 refills | Status: DC

## 2018-06-03 NOTE — Telephone Encounter (Signed)
Contacted Nicole Barrera concerning anemia panel.  Hematocrit appears to be stable and improved.  I do suspect there is iron deficiency anemia and recommended increasing her iron 324 mg every other day to once daily and gradually increase at 1 tablet every 2 weeks with a goal of achieving 3 tablets daily and cutting down by 1 tablet if she develops constipation.  Patient agreement with plan.  Also discussed low B12 at 207.  Patient would like to be treated and will begin cyanocobalamin treatment 2 tablets for 2 weeks and then decrease to 1 tablet daily and will follow-up in 6 weeks for a recheck of her B12 level.  Patient to follow-up with GI for diagnostic colonoscopy.  Harriet Butte, [place], PGY-3

## 2018-06-04 LAB — ANEMIA PANEL
FERRITIN: 76 ng/mL (ref 15–150)
Folate, Hemolysate: 335 ng/mL
Folate, RBC: 1057 ng/mL (ref >498)
Hematocrit: 31.7 % — ABNORMAL LOW (ref 34.0–46.6)
Iron Saturation: 18 % (ref 15–55)
Iron: 40 ug/dL (ref 27–159)
RETIC CT PCT: 1 % (ref 0.6–2.6)
TIBC: 222 ug/dL — AB (ref 250–450)
UIBC: 182 ug/dL (ref 131–425)
Vitamin B-12: 207 pg/mL — ABNORMAL LOW (ref 232–1245)

## 2018-06-07 ENCOUNTER — Encounter: Payer: Self-pay | Admitting: Gastroenterology

## 2018-06-21 ENCOUNTER — Telehealth: Payer: Self-pay | Admitting: *Deleted

## 2018-06-21 NOTE — Telephone Encounter (Signed)
Covid-19 travel screening questions  Have you traveled in the last 14 days? no If yes where?  Do you now or have you had a fever in the last 14 days? no  Do you have any respiratory symptoms of shortness of breath or cough now or in the last 14 days? no  Do you have a medical history of Congestive Heart Failure? no  Do you have a medical history of lung disease? no  Do you have any family members or close contacts with diagnosed or suspected Covid-19? no       

## 2018-06-22 ENCOUNTER — Ambulatory Visit (AMBULATORY_SURGERY_CENTER): Payer: Self-pay

## 2018-06-22 ENCOUNTER — Other Ambulatory Visit: Payer: Self-pay

## 2018-06-22 VITALS — Temp 96.6°F | Ht 64.0 in | Wt 131.4 lb

## 2018-06-22 DIAGNOSIS — R195 Other fecal abnormalities: Secondary | ICD-10-CM

## 2018-06-22 MED ORDER — PEG-KCL-NACL-NASULF-NA ASC-C 140 G PO SOLR: 1.0000 | Freq: Once | ORAL | 0 refills | Status: AC

## 2018-06-22 NOTE — Progress Notes (Signed)
Denies allergies to eggs or soy products. Denies complication of anesthesia or sedation. Denies use of weight loss medication. Denies use of O2.   Emmi instructions given to the patient.   Patient does not have insurance so a sample of Plenvu was given to the patient. Lot # [id number] exp. Date 09/2018. Patient verbalizes understanding of prep instructions.

## 2018-06-30 ENCOUNTER — Telehealth: Payer: Self-pay

## 2018-06-30 NOTE — Telephone Encounter (Signed)
Called patient to inform her that her procedure would have to be rescheduled due to COVID-19, but that we would be contacting her to reschedule when we were up and running normal again.

## 2018-07-06 ENCOUNTER — Encounter: Payer: Self-pay | Admitting: Gastroenterology

## 2018-08-04 ENCOUNTER — Telehealth: Payer: Self-pay | Admitting: *Deleted

## 2018-08-04 NOTE — Telephone Encounter (Signed)
Called patient to reschedule colonoscopy previously cancelled due to Covid-19. Left message, asking pt to return call to me, need to verify Rx and redo instructions.

## 2018-08-05 ENCOUNTER — Other Ambulatory Visit: Payer: Self-pay | Admitting: Family Medicine

## 2018-08-05 DIAGNOSIS — E876 Hypokalemia: Secondary | ICD-10-CM

## 2018-08-05 NOTE — Telephone Encounter (Signed)
Patient returning call to reschedule. Patient requests call at [phone].

## 2018-08-09 NOTE — Telephone Encounter (Signed)
Called patient and reviewed instructions. Instructions redone and mailed to patient.

## 2018-08-09 NOTE — Telephone Encounter (Signed)
Pt rescheduled again and requested a call back to discuss prep instructions.

## 2018-08-30 ENCOUNTER — Telehealth: Payer: Self-pay

## 2018-08-30 NOTE — Telephone Encounter (Signed)
Left message

## 2018-08-31 ENCOUNTER — Telehealth: Payer: Self-pay | Admitting: *Deleted

## 2018-08-31 NOTE — Telephone Encounter (Signed)
Left message for second COVID screen. SM

## 2018-09-01 ENCOUNTER — Encounter: Payer: Self-pay | Admitting: Gastroenterology

## 2018-09-01 ENCOUNTER — Other Ambulatory Visit: Payer: Self-pay

## 2018-09-01 ENCOUNTER — Ambulatory Visit (AMBULATORY_SURGERY_CENTER): Payer: Self-pay | Admitting: Gastroenterology

## 2018-09-01 VITALS — BP 161/87 | HR 53 | Temp 98.5°F | Resp 16 | Ht 64.0 in | Wt 131.0 lb

## 2018-09-01 DIAGNOSIS — R195 Other fecal abnormalities: Secondary | ICD-10-CM

## 2018-09-01 MED ORDER — SODIUM CHLORIDE 0.9 % IV SOLN: 500.0000 mL | Freq: Once | INTRAVENOUS | Status: DC

## 2018-09-01 NOTE — Progress Notes (Signed)
To PACU, VSS. Report to [email or web address].tb 

## 2018-09-01 NOTE — Patient Instructions (Signed)
Please read handouts provided. Continue present medications.     YOU HAD AN ENDOSCOPIC PROCEDURE TODAY AT THE [facility]:   Refer to the procedure report that was given to you for any specific questions about what was found during the examination.  If the procedure report does not answer your questions, please call your gastroenterologist to clarify.  If you requested that your care partner not be given the details of your procedure findings, then the procedure report has been included in a sealed envelope for you to review at your convenience later.  YOU SHOULD EXPECT: Some feelings of bloating in the abdomen. Passage of more gas than usual.  Walking can help get rid of the air that was put into your GI tract during the procedure and reduce the bloating. If you had a lower endoscopy (such as a colonoscopy or flexible sigmoidoscopy) you may notice spotting of blood in your stool or on the toilet paper. If you underwent a bowel prep for your procedure, you may not have a normal bowel movement for a few days.  Please Note:  You might notice some irritation and congestion in your nose or some drainage.  This is from the oxygen used during your procedure.  There is no need for concern and it should clear up in a day or so.  SYMPTOMS TO REPORT IMMEDIATELY:   Following lower endoscopy (colonoscopy or flexible sigmoidoscopy):  Excessive amounts of blood in the stool  Significant tenderness or worsening of abdominal pains  Swelling of the abdomen that is new, acute  Fever of 100F or higher   For urgent or emergent issues, a gastroenterologist can be reached at any hour by calling [phone].   DIET:  We do recommend a small meal at first, but then you may proceed to your regular diet.  Drink plenty of fluids but you should avoid alcoholic beverages for 24 hours.  ACTIVITY:  You should plan to take it easy for the rest of today and you should NOT DRIVE or use heavy machinery  until tomorrow (because of the sedation medicines used during the test).    FOLLOW UP: Our staff will call the number listed on your records 48-72 hours following your procedure to check on you and address any questions or concerns that you may have regarding the information given to you following your procedure. If we do not reach you, we will leave a message.  We will attempt to reach you two times.  During this call, we will ask if you have developed any symptoms of COVID 19. If you develop any symptoms (ie: fever, flu-like symptoms, shortness of breath, cough etc.) before then, please call [phone].  If you test positive for Covid 19 in the 2 weeks post procedure, please call and report this information to us.    If any biopsies were taken you will be contacted by phone or by letter within the next 1-3 weeks.  Please call us at [phone] if you have not heard about the biopsies in 3 weeks.    SIGNATURES/CONFIDENTIALITY: You and/or your care partner have signed paperwork which will be entered into your electronic medical record.  These signatures attest to the fact that that the information above on your After Visit Summary has been reviewed and is understood.  Full responsibility of the confidentiality of this discharge information lies with you and/or your care-partner. 

## 2018-09-01 NOTE — Progress Notes (Signed)
History reviewed today  Temp and Covid 19 questions, C. [place].  VS Lorrin Goodell

## 2018-09-01 NOTE — Op Note (Signed)
[facility] Patient Name: Nicole Barrera Procedure Date: 09/01/2018 8:58 AM MRN: [id number] Endoscopist: Mauri Pole , MD Age: 58 Referring MD:  Date of Birth: Aug 21, 1960 Gender: Female Account #: [id number] Procedure:                Colonoscopy Indications:              Positive Cologuard test Medicines:                Monitored Anesthesia Care Procedure:                Pre-Anesthesia Assessment:                           - Prior to the procedure, a History and Physical                            was performed, and patient medications and                            allergies were reviewed. The patient's tolerance of                            previous anesthesia was also reviewed. The risks                            and benefits of the procedure and the sedation                            options and risks were discussed with the patient.                            All questions were answered, and informed consent                            was obtained. Prior Anticoagulants: The patient has                            taken no previous anticoagulant or antiplatelet                            agents. ASA Grade Assessment: II - A patient with                            mild systemic disease. After reviewing the risks                            and benefits, the patient was deemed in                            satisfactory condition to undergo the procedure.                           After obtaining informed consent, the colonoscope  was passed under direct vision. Throughout the                            procedure, the patient's blood pressure, pulse, and                            oxygen saturations were monitored continuously. The                            Colonoscope was introduced through the anus and                            advanced to the the cecum, identified by                            appendiceal orifice and ileocecal valve.  The                            colonoscopy was performed without difficulty. The                            patient tolerated the procedure well. The quality                            of the bowel preparation was excellent. The                            ileocecal valve, appendiceal orifice, and rectum                            were photographed. Scope In: 9:03:31 AM Scope Out: 9:20:30 AM Scope Withdrawal Time: 0 hours 10 minutes 31 seconds  Total Procedure Duration: 0 hours 16 minutes 59 seconds  Findings:                 The perianal and digital rectal examinations were                            normal.                           Non-bleeding internal hemorrhoids were found during                            retroflexion. The hemorrhoids were small.                           The exam was otherwise without abnormality. Complications:            No immediate complications. Impression:               - Non-bleeding internal hemorrhoids.                           - The examination was otherwise normal.                           -  No specimens collected. Recommendation:           - Patient has a contact number available for                            emergencies. The signs and symptoms of potential                            delayed complications were discussed with the                            patient. Return to normal activities tomorrow.                            Written discharge instructions were provided to the                            patient.                           - Resume previous diet.                           - Continue present medications.                           - Repeat colonoscopy in 10 years for screening                            purposes. Mauri Pole, MD 09/01/2018 9:26:36 AM This report has been signed electronically.

## 2018-09-03 ENCOUNTER — Telehealth: Payer: Self-pay

## 2018-09-03 NOTE — Telephone Encounter (Signed)
  Follow up Call-  Call back number 09/01/2018  Post procedure Call Back phone  # [phone]  Permission to leave phone message Yes  Some recent data might be hidden     Patient questions:  Do you have a fever, pain , or abdominal swelling? No. Pain Score  0 *  Have you tolerated food without any problems? Yes.    Have you been able to return to your normal activities? Yes.    Do you have any questions about your discharge instructions: Diet   No. Medications  No. Follow up visit  No.  Do you have questions or concerns about your Care? No.  Pt. Denies developing any symptoms of Covid 19 since leaving our facility.  Actions: * If pain score is 4 or above: No action needed, pain <4.

## 2018-10-19 ENCOUNTER — Other Ambulatory Visit: Payer: Self-pay | Admitting: *Deleted

## 2018-10-19 DIAGNOSIS — I10 Essential (primary) hypertension: Secondary | ICD-10-CM

## 2018-10-19 MED ORDER — HYDROCHLOROTHIAZIDE 25 MG PO TABS: 25.0000 mg | ORAL_TABLET | Freq: Every day | ORAL | 3 refills | Status: DC

## 2019-02-28 ENCOUNTER — Other Ambulatory Visit: Payer: Self-pay

## 2019-02-28 DIAGNOSIS — I10 Essential (primary) hypertension: Secondary | ICD-10-CM

## 2019-03-01 MED ORDER — AMLODIPINE BESYLATE 10 MG PO TABS: 10.0000 mg | ORAL_TABLET | Freq: Every day | ORAL | 1 refills | Status: DC

## 2019-04-07 ENCOUNTER — Other Ambulatory Visit: Payer: Self-pay

## 2019-04-07 ENCOUNTER — Ambulatory Visit: Payer: Self-pay | Attending: Internal Medicine

## 2019-04-07 DIAGNOSIS — Z20822 Contact with and (suspected) exposure to covid-19: Secondary | ICD-10-CM | POA: Insufficient documentation

## 2019-04-09 LAB — NOVEL CORONAVIRUS, NAA: SARS-CoV-2, NAA: NOT DETECTED

## 2019-05-04 ENCOUNTER — Ambulatory Visit (INDEPENDENT_AMBULATORY_CARE_PROVIDER_SITE_OTHER): Payer: Self-pay | Admitting: Family Medicine

## 2019-05-04 ENCOUNTER — Other Ambulatory Visit: Payer: Self-pay

## 2019-05-04 VITALS — BP 125/80 | HR 70 | Wt 144.4 lb

## 2019-05-04 DIAGNOSIS — E875 Hyperkalemia: Secondary | ICD-10-CM

## 2019-05-04 DIAGNOSIS — M654 Radial styloid tenosynovitis [de Quervain]: Secondary | ICD-10-CM

## 2019-05-04 DIAGNOSIS — D649 Anemia, unspecified: Secondary | ICD-10-CM

## 2019-05-04 DIAGNOSIS — Z0000 Encounter for general adult medical examination without abnormal findings: Secondary | ICD-10-CM

## 2019-05-04 DIAGNOSIS — E876 Hypokalemia: Secondary | ICD-10-CM

## 2019-05-04 MED ORDER — TETANUS-DIPHTH-ACELL PERTUSSIS 5-2.5-18.5 LF-MCG/0.5 IM SUSP: 0.5000 mL | Freq: Once | INTRAMUSCULAR | 0 refills | Status: AC

## 2019-05-04 MED ORDER — MELOXICAM 15 MG PO TABS: 15.0000 mg | ORAL_TABLET | Freq: Every day | ORAL | 0 refills | Status: DC

## 2019-05-04 NOTE — Patient Instructions (Signed)
It was great seeing you today!  I think for your trigger finger and de Quervain's tenosynovitis we can try something called meloxicam.  It is a 15 mg tablet 1 time per day.  You could try this for about 2 weeks and see how this works for you.  I gave you a printed prescription for a tetanus booster.  You can try the melatonin for sleep, I do not think there are any side effects.  We will check some blood work to evaluate your iron and your potassium.

## 2019-05-05 LAB — BASIC METABOLIC PANEL
BUN/Creatinine Ratio: 16 (ref 9–23)
BUN: 14 mg/dL (ref 6–24)
CO2: 24 mmol/L (ref 20–29)
Calcium: 9.2 mg/dL (ref 8.7–10.2)
Chloride: 107 mmol/L — ABNORMAL HIGH (ref 96–106)
Creatinine, Ser: 0.9 mg/dL (ref 0.57–1.00)
GFR calc Af Amer: 82 mL/min/{1.73_m2} (ref >59)
GFR calc non Af Amer: 71 mL/min/{1.73_m2} (ref >59)
Glucose: 84 mg/dL (ref 65–99)
Potassium: 3.6 mmol/L (ref 3.5–5.2)
Sodium: 140 mmol/L (ref 134–144)

## 2019-05-05 LAB — IRON,TIBC AND FERRITIN PANEL
Ferritin: 52 ng/mL (ref 15–150)
Iron Saturation: 20 % (ref 15–55)
Iron: 44 ug/dL (ref 27–159)
Total Iron Binding Capacity: 218 ug/dL — ABNORMAL LOW (ref 250–450)
UIBC: 174 ug/dL (ref 131–425)

## 2019-05-05 LAB — CBC WITH DIFFERENTIAL/PLATELET
Basophils Absolute: 0 10*3/uL (ref 0.0–0.2)
Basos: 1 %
EOS (ABSOLUTE): 0.1 10*3/uL (ref 0.0–0.4)
Eos: 2 %
Hematocrit: 30.6 % — ABNORMAL LOW (ref 34.0–46.6)
Hemoglobin: 10.2 g/dL — ABNORMAL LOW (ref 11.1–15.9)
Immature Grans (Abs): 0 10*3/uL (ref 0.0–0.1)
Immature Granulocytes: 0 %
Lymphocytes Absolute: 1.3 10*3/uL (ref 0.7–3.1)
Lymphs: 29 %
MCH: 28.9 pg (ref 26.6–33.0)
MCHC: 33.3 g/dL (ref 31.5–35.7)
MCV: 87 fL (ref 79–97)
Monocytes Absolute: 0.3 10*3/uL (ref 0.1–0.9)
Monocytes: 7 %
Neutrophils Absolute: 2.7 10*3/uL (ref 1.4–7.0)
Neutrophils: 61 %
Platelets: 320 10*3/uL (ref 150–450)
RBC: 3.53 x10E6/uL — ABNORMAL LOW (ref 3.77–5.28)
RDW: 14.3 % (ref 11.7–15.4)
WBC: 4.4 10*3/uL (ref 3.4–10.8)

## 2019-05-06 ENCOUNTER — Encounter: Payer: Self-pay | Admitting: Family Medicine

## 2019-05-06 NOTE — Assessment & Plan Note (Signed)
Potassium 3.6. normal today. Can recheck when clinically indicated

## 2019-05-06 NOTE — Assessment & Plan Note (Signed)
Recommended continue bracing. Will try mobic 15mg  for a week to see if she gets good benefit.

## 2019-05-06 NOTE — Progress Notes (Signed)
   CHIEF COMPLAINT / HPI: 59 year old female who presents for iron and potassium check. She has a history of iron deficiency anemia and hyperkalemia. She is also interested in getting all health maintenance items addressed.   Her only other issue is continued pain from her known deQuervain's tenosynovitis. She states that it is giving her significant problems despite multiple injections in the past and bracing. She has tried ibuprofen but doesn't like to take medications so has been afraid to try it more than once or twice.  PERTINENT  PMH / PSH: dequervains, anemia   OBJECTIVE: BP 125/80   Pulse 70   Wt 144 lb 6.4 oz (65.5 kg)   BMI 24.79 kg/m   Gen: 59 year old AA female, no acute distress, resting comfortably CV: rrr, no m/r/g Resp: lungs ctab Abd: soft,non-tender, non-distended Neuro: Alert and oriented, Speech clear, No gross deficits  Left hand: + finkelstein test, negative cmc grind test  ASSESSMENT / PLAN:  De Quervain's tenosynovitis Recommended continue bracing. Will try mobic 15mg  for a week to see if she gets good benefit.  Hypokalemia Potassium 3.6. normal today. Can recheck when clinically indicated  Normocytic anemia Hemoglobin 10.2 from 9.7. MCV 87. Given improvement no need for further management at this time.  Healthcare maintenance Gave rx for tdap prescription, can schedule appointment at health department for discounted vaccine. Declined flu vaccine.   Guadalupe Dawn MD PGY-3 Family Medicine Resident [place]

## 2019-05-06 NOTE — Assessment & Plan Note (Signed)
Gave rx for tdap prescription, can schedule appointment at health department for discounted vaccine. Declined flu vaccine.

## 2019-05-06 NOTE — Assessment & Plan Note (Signed)
Hemoglobin 10.2 from 9.7. MCV 87. Given improvement no need for further management at this time.

## 2019-07-02 ENCOUNTER — Ambulatory Visit: Payer: Self-pay

## 2019-09-07 ENCOUNTER — Other Ambulatory Visit: Payer: Self-pay

## 2019-09-07 DIAGNOSIS — Z1231 Encounter for screening mammogram for malignant neoplasm of breast: Secondary | ICD-10-CM

## 2019-09-09 ENCOUNTER — Other Ambulatory Visit: Payer: Self-pay | Admitting: Family Medicine

## 2019-09-09 DIAGNOSIS — Z1231 Encounter for screening mammogram for malignant neoplasm of breast: Secondary | ICD-10-CM

## 2019-09-14 ENCOUNTER — Ambulatory Visit
Admission: RE | Admit: 2019-09-14 | Discharge: 2019-09-14 | Disposition: A | Discharge status: Home / Self Care | Payer: No Typology Code available for payment source | Source: Ambulatory Visit | Attending: Family Medicine | Admitting: Family Medicine

## 2019-09-14 ENCOUNTER — Other Ambulatory Visit: Payer: Self-pay

## 2019-09-14 DIAGNOSIS — Z1231 Encounter for screening mammogram for malignant neoplasm of breast: Secondary | ICD-10-CM

## 2019-09-22 ENCOUNTER — Ambulatory Visit: Payer: Self-pay | Admitting: Family Medicine

## 2019-09-30 ENCOUNTER — Ambulatory Visit: Payer: Self-pay | Admitting: Family Medicine

## 2019-11-17 ENCOUNTER — Other Ambulatory Visit: Payer: Self-pay | Admitting: Family Medicine

## 2019-11-17 DIAGNOSIS — I10 Essential (primary) hypertension: Secondary | ICD-10-CM

## 2019-11-19 IMAGING — MG DIGITAL SCREENING BILATERAL MAMMOGRAM WITH TOMO AND CAD
8 series · 9 of 24 positions shown · non-contrast
Comparison: Previous exam(s).

CLINICAL DATA: Screening.

EXAM:
DIGITAL SCREENING BILATERAL MAMMOGRAM WITH TOMO AND CAD

[L MLO synth-2D]
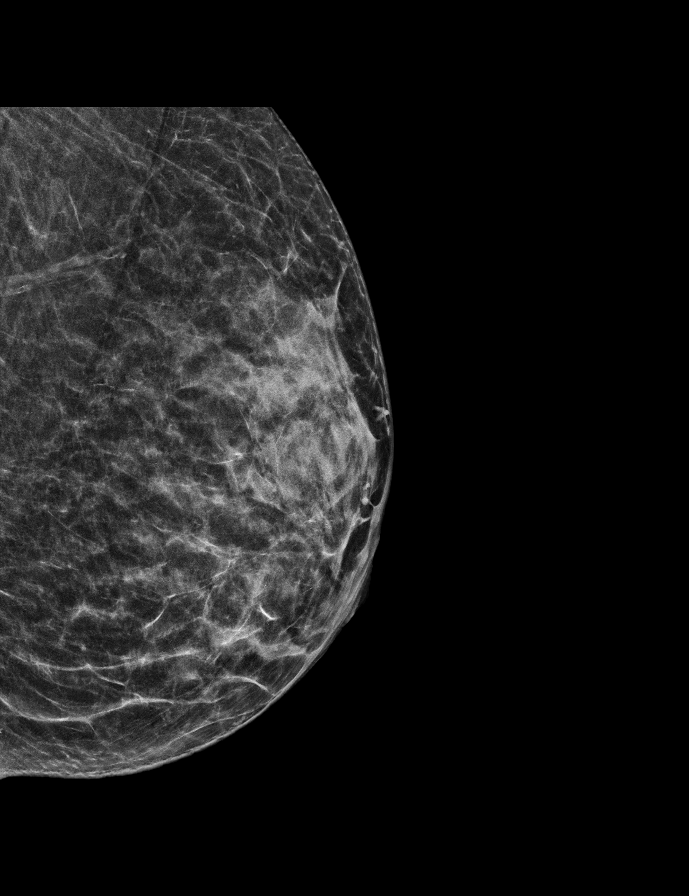

[L CC synth-2D]
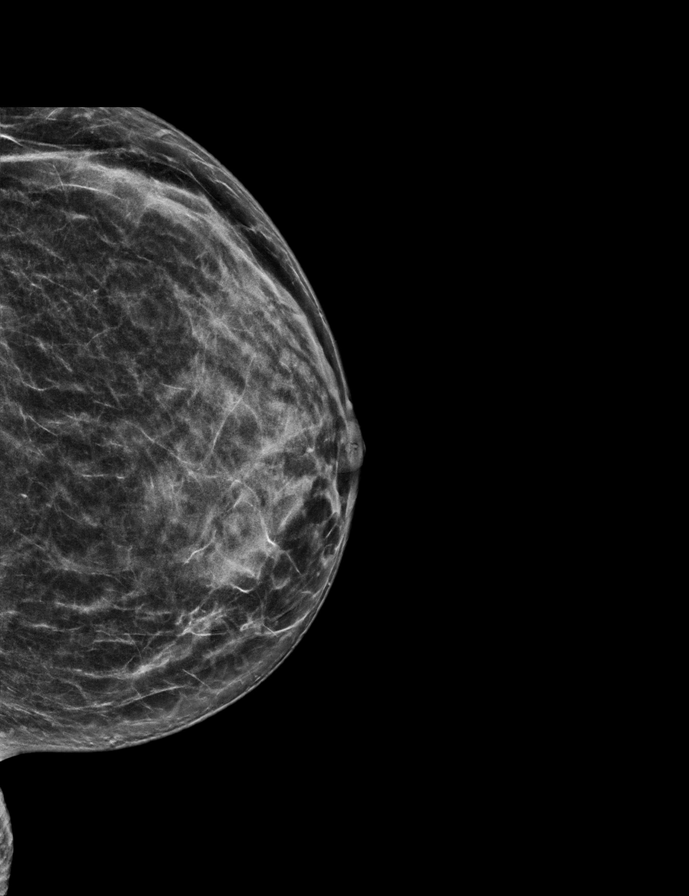

[R MLO synth-2D]
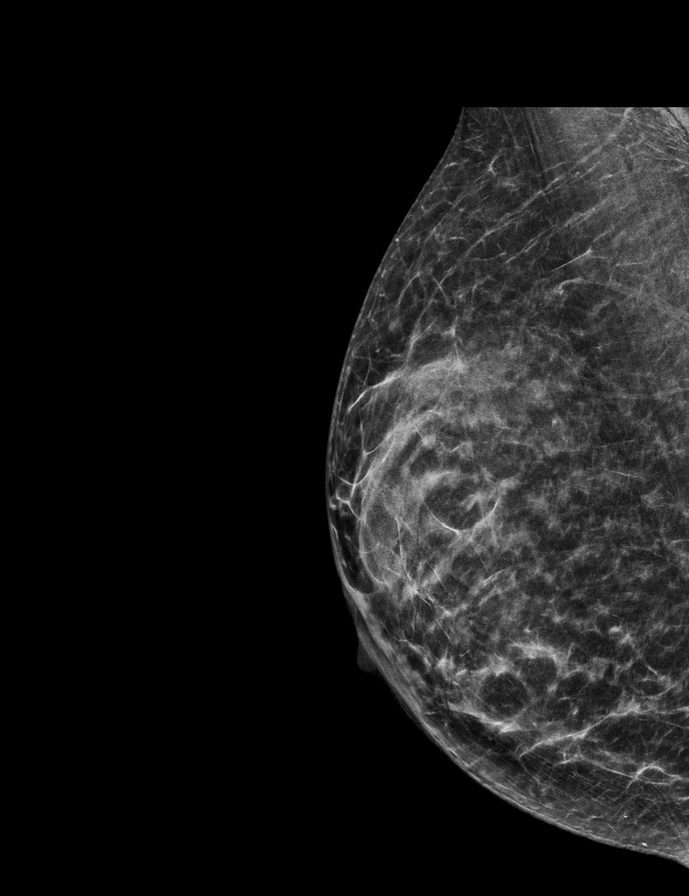

[R CC synth-2D]
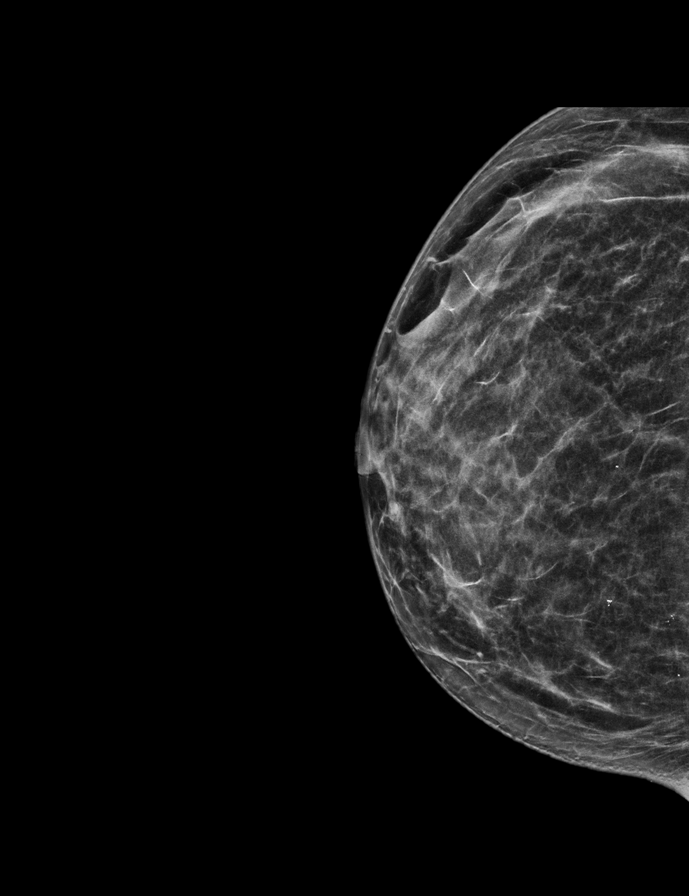

[L MLO tomo · 2 of 49 frames shown]
[frame 16/49]
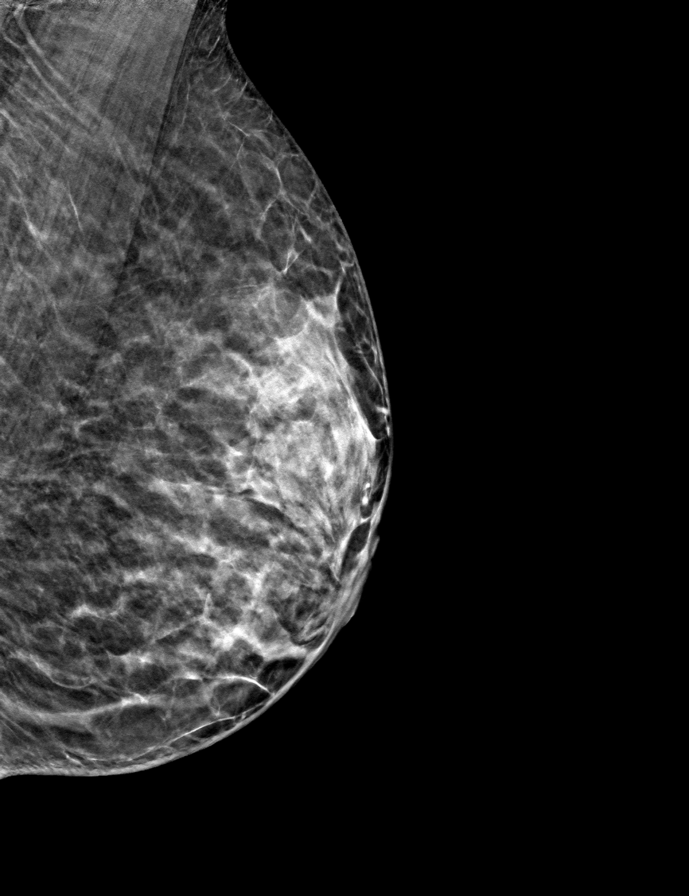
[frame 25/49]
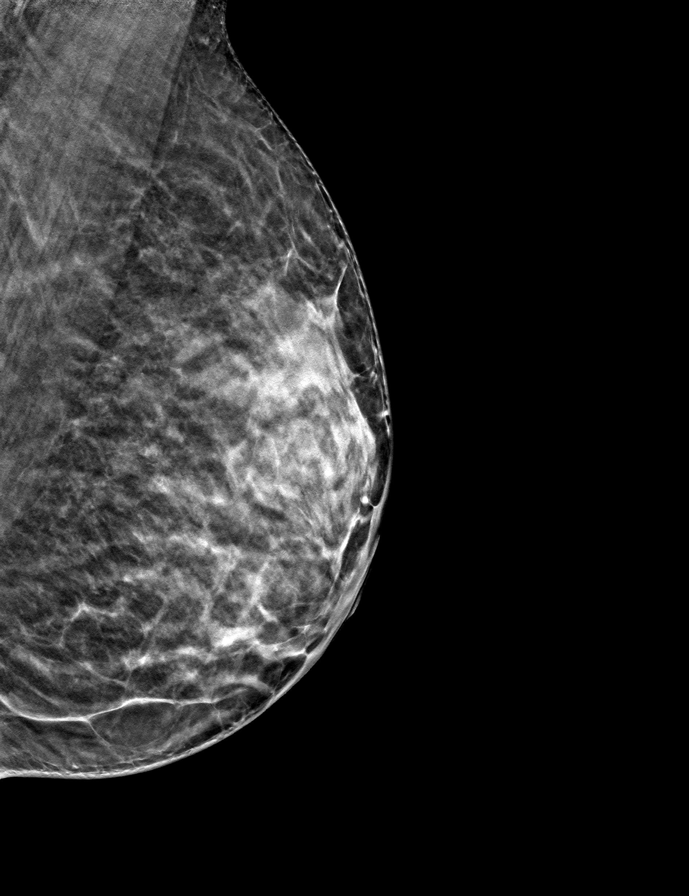

[R CC tomo · tomo slice 26/51.0]
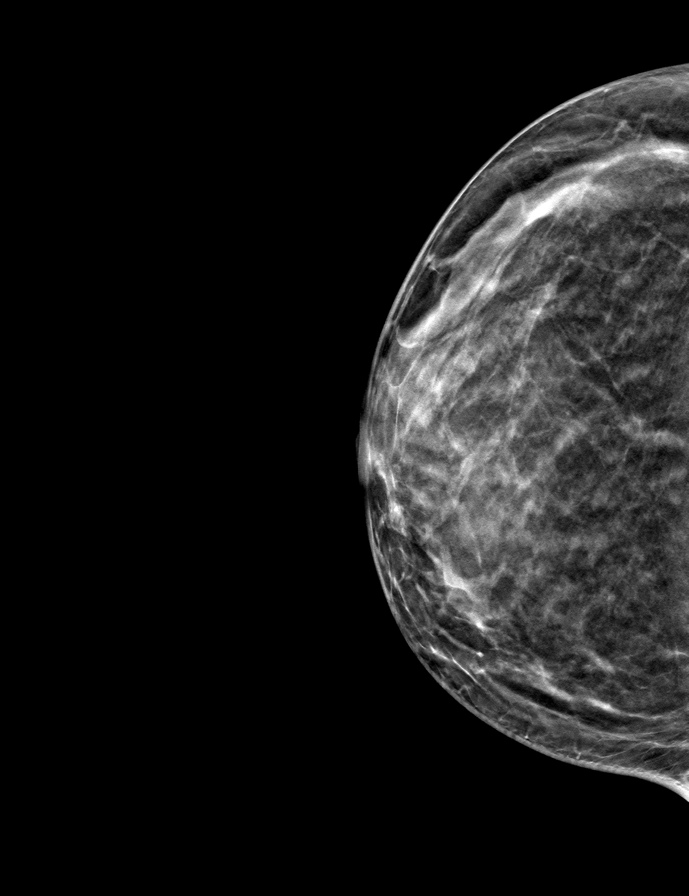

[R MLO tomo · tomo slice 25/50.0]
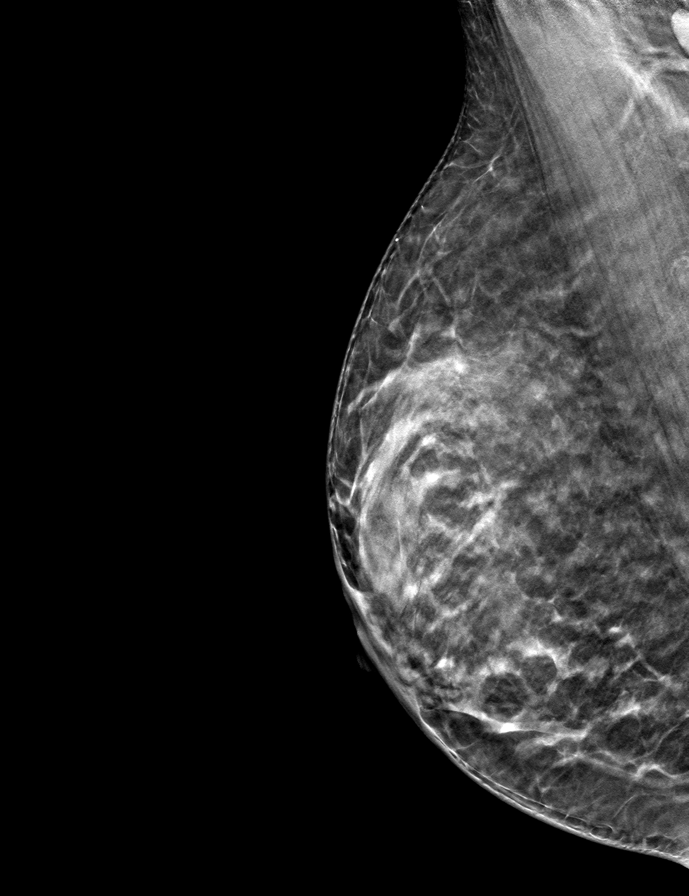

[L CC tomo · tomo slice 27/53.0]
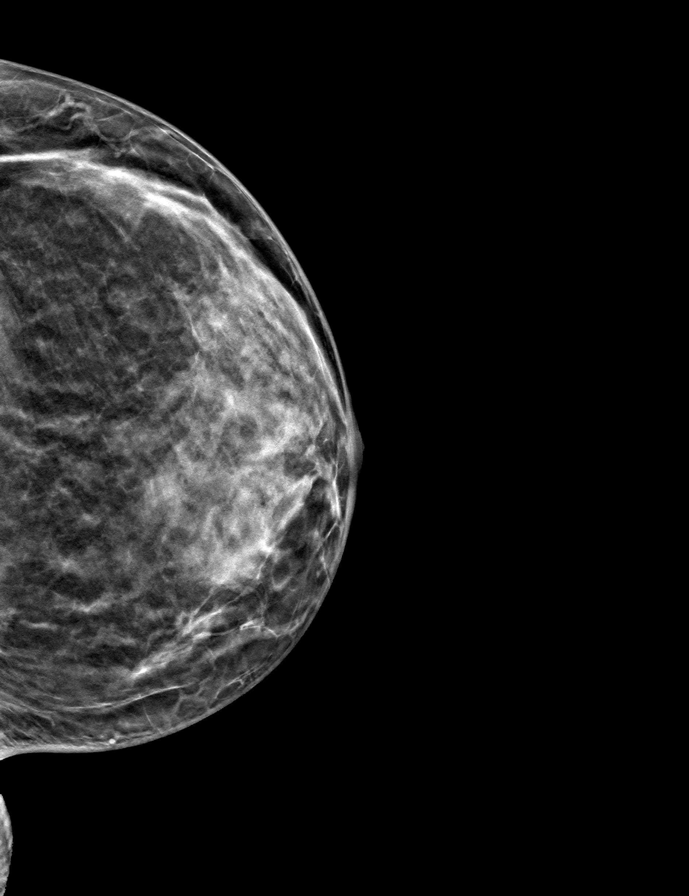

[9 of 24 positions shown; findings below may reference images not displayed]

ACR Breast Density Category c: The breast tissue is heterogeneously
dense, which may obscure small masses.
FINDINGS: There are no findings suspicious for malignancy. Images were
processed with CAD.
IMPRESSION: No mammographic evidence of malignancy. A result letter of this
screening mammogram will be mailed directly to the patient.

RECOMMENDATION:
Screening mammogram in one year. (Code:[id number])

BI-RADS CATEGORY  1: Negative.

## 2019-12-01 ENCOUNTER — Other Ambulatory Visit: Payer: Self-pay | Admitting: Family Medicine

## 2019-12-01 DIAGNOSIS — I10 Essential (primary) hypertension: Secondary | ICD-10-CM

## 2019-12-14 ENCOUNTER — Telehealth: Payer: No Typology Code available for payment source | Admitting: Nurse Practitioner

## 2019-12-14 DIAGNOSIS — H5789 Other specified disorders of eye and adnexa: Secondary | ICD-10-CM

## 2019-12-14 NOTE — Progress Notes (Signed)
Based on what you shared with me it looks like you have red eye,that should be evaluated in a face to face office visit. This doe sot sound like anything you need an antibiotic or. You need to go see and eye doctor and let them look at your eye to get a correct diagnosis.    NOTE: If you entered your credit card information for this eVisit, you will not be charged. You may see a "hold" on your card for the $35 but that hold will drop off and you will not have a charge processed.  If you are having a true medical emergency please call 911.     For an urgent face to face visit, [place] has four urgent care centers for your convenience:   . [facility]    [phone]                  Get Driving Directions  [address], [place] [address] . 10 am to 8 pm Monday-Friday . 12 pm to 8 pm Saturday-Sunday   . [facility] at [place]                  Get Driving Directions  [address], [place], [place] [address] . 8 am to 8 pm Monday-Friday . 9 am to 6 pm Saturday . 11 am to 6 pm Sunday   . [facility] at [place]                  Get Driving Directions   [address] [place], [place] [address] . 8 am to 8 pm Monday-Friday . 8 am to 4 pm Saturday-Sunday    . [facility] at [place]                    Get Driving Directions  [phone]  [address], [place], [place] [address]  . Monday-Friday, 12 PM to 6 PM    Your e-visit answers were reviewed by a board certified advanced clinical practitioner to complete your personal care plan.  Thank you for using e-Visits.

## 2020-03-28 ENCOUNTER — Other Ambulatory Visit: Payer: Self-pay | Admitting: Family Medicine

## 2020-03-28 DIAGNOSIS — I10 Essential (primary) hypertension: Secondary | ICD-10-CM

## 2020-03-29 ENCOUNTER — Other Ambulatory Visit: Payer: Self-pay | Admitting: *Deleted

## 2020-03-29 DIAGNOSIS — I10 Essential (primary) hypertension: Secondary | ICD-10-CM

## 2020-04-03 ENCOUNTER — Encounter: Payer: Self-pay | Admitting: Family Medicine

## 2020-04-09 ENCOUNTER — Ambulatory Visit: Payer: No Typology Code available for payment source | Admitting: Family Medicine

## 2020-05-02 ENCOUNTER — Ambulatory Visit: Payer: No Typology Code available for payment source | Attending: Internal Medicine

## 2020-05-02 DIAGNOSIS — Z23 Encounter for immunization: Secondary | ICD-10-CM

## 2020-05-02 NOTE — Progress Notes (Signed)
   Covid-19 Vaccination Clinic  Name:  Nicole Barrera    MRN: [id number] DOB: May 08, 1960  05/02/2020  Nicole Barrera was observed post Covid-19 immunization for 15 minutes without incident. She was provided with Vaccine Information Sheet and instruction to access the V-Safe system.   Nicole Barrera was instructed to call 911 with any severe reactions post vaccine: Marland Kitchen Difficulty breathing  . Swelling of face and throat  . A fast heartbeat  . A bad rash all over body  . Dizziness and weakness   Immunizations Administered    Name Date Dose VIS Date Route   PFIZER Comrnaty(Gray TOP) Covid-19 Vaccine 05/02/2020 10:25 AM 0.3 mL 03/08/2020 Intramuscular   Manufacturer: [place]   Lot: [id number]   [place]: [phone]

## 2020-05-03 ENCOUNTER — Ambulatory Visit: Payer: No Typology Code available for payment source

## 2020-05-06 NOTE — Progress Notes (Deleted)
    SUBJECTIVE:   CHIEF COMPLAINT / HPI: potassium level check   Hx of hyperkalemia  Patient reports she feels that her potassium level is low. She reports symptoms including ***   Healthcare Maintenance  Patient reports she was/was not able to get her TDap vaccine ***  She would like to receive her influenza vaccine***   PERTINENT  PMH / PSH: ***  OBJECTIVE:   There were no vitals taken for this visit.  General: *** appearing stated age in no acute distress HEENT: MMM, no oral lesions noted,Neck non-tender without lymphadenopathy Cardio: Normal S1 and S2, no S3 or S4. Rhythm is regular. No murmurs or rubs.  Bilateral radial pulses palpable Pulm: Clear to auscultation bilaterally, no crackles, wheezing, or diminished breath sounds. Normal respiratory effort Abdomen: Bowel sounds normal. Abdomen soft and non-tender.  Extremities: No peripheral edema. Warm & well perfused.  Neuro: pt alert and oriented x4, follows commands, PERRLA, EOMI bilaterally   ASSESSMENT/PLAN:   No problem-specific Assessment & Plan notes found for this encounter.   BMP  Eulis Foster, MD [place]

## 2020-05-08 ENCOUNTER — Ambulatory Visit: Payer: No Typology Code available for payment source | Admitting: Family Medicine

## 2020-05-21 NOTE — Progress Notes (Deleted)
    SUBJECTIVE:   CHIEF COMPLAINT / HPI: hypokalemia   Patient presents today with report of feeling her potassium levels are too low. She reports ***   PERTINENT  PMH / PSH:  HTN  Hypokalemia  Tobacco use   OBJECTIVE:   There were no vitals taken for this visit.  General: female appearing stated age in no acute distress HEENT: MMM, no oral lesions noted,Neck non-tender without lymphadenopathy, masses or thyromegaly*** Cardio: Normal S1 and S2, no S3 or S4. Rhythm is regular***. No murmurs or rubs.  Bilateral radial pulses palpable Pulm: Clear to auscultation bilaterally, no crackles, wheezing, or diminished breath sounds. Normal respiratory effort, stable on *** Abdomen: Bowel sounds normal. Abdomen soft and non-tender. *** Extremities: No peripheral edema. Warm/ well perfused. *** Neuro: pt alert and oriented x4    ASSESSMENT/PLAN:   No problem-specific Assessment & Plan notes found for this encounter.     Eulis Foster, MD [place]

## 2020-05-22 ENCOUNTER — Ambulatory Visit: Payer: No Typology Code available for payment source | Admitting: Family Medicine

## 2020-06-10 ENCOUNTER — Other Ambulatory Visit: Payer: Self-pay | Admitting: Family Medicine

## 2020-06-10 DIAGNOSIS — I10 Essential (primary) hypertension: Secondary | ICD-10-CM

## 2020-07-02 ENCOUNTER — Other Ambulatory Visit: Payer: Self-pay | Admitting: Family Medicine

## 2020-07-02 DIAGNOSIS — I10 Essential (primary) hypertension: Secondary | ICD-10-CM

## 2020-08-16 ENCOUNTER — Other Ambulatory Visit: Payer: Self-pay | Admitting: Family Medicine

## 2020-09-05 ENCOUNTER — Other Ambulatory Visit: Payer: Self-pay | Admitting: Obstetrics and Gynecology

## 2020-09-05 DIAGNOSIS — Z1231 Encounter for screening mammogram for malignant neoplasm of breast: Secondary | ICD-10-CM

## 2020-09-12 ENCOUNTER — Other Ambulatory Visit: Payer: Self-pay | Admitting: Family Medicine

## 2020-09-12 DIAGNOSIS — I10 Essential (primary) hypertension: Secondary | ICD-10-CM

## 2020-10-04 ENCOUNTER — Ambulatory Visit: Payer: No Typology Code available for payment source

## 2020-10-09 ENCOUNTER — Ambulatory Visit: Payer: No Typology Code available for payment source | Attending: Critical Care Medicine

## 2020-10-09 ENCOUNTER — Other Ambulatory Visit: Payer: Self-pay

## 2020-10-09 DIAGNOSIS — Z23 Encounter for immunization: Secondary | ICD-10-CM

## 2020-10-09 NOTE — Progress Notes (Signed)
   Covid-19 Vaccination Clinic  Name:  Nicole Barrera    MRN: [id number] DOB: [birth year]  10/09/2020  [name] was observed post Covid-19 immunization for 15 minutes without incident. She was provided with Vaccine Information Sheet and instruction to access the V-Safe system.   [name] was instructed to call 911 with any severe reactions post vaccine: Difficulty breathing  Swelling of face and throat  A fast heartbeat  A bad rash all over body  Dizziness and weakness   Immunizations Administered     Name Date Dose VIS Date Route   PFIZER Comrnaty(Gray TOP) Covid-19 Vaccine 10/09/2020  2:44 PM 0.3 mL 03/08/2020 Intramuscular   Manufacturer: [place]   Lot: [id number]   [place]: [phone]

## 2020-11-26 ENCOUNTER — Other Ambulatory Visit: Payer: Self-pay | Admitting: Obstetrics and Gynecology

## 2020-11-26 DIAGNOSIS — Z1231 Encounter for screening mammogram for malignant neoplasm of breast: Secondary | ICD-10-CM

## 2020-11-27 ENCOUNTER — Ambulatory Visit: Payer: Self-pay

## 2020-12-11 ENCOUNTER — Other Ambulatory Visit: Payer: Self-pay | Admitting: Family Medicine

## 2020-12-11 DIAGNOSIS — I10 Essential (primary) hypertension: Secondary | ICD-10-CM

## 2021-01-24 ENCOUNTER — Ambulatory Visit: Payer: No Typology Code available for payment source | Admitting: *Deleted

## 2021-01-24 ENCOUNTER — Ambulatory Visit
Admission: RE | Admit: 2021-01-24 | Discharge: 2021-01-24 | Disposition: A | Discharge status: Home / Self Care | Payer: No Typology Code available for payment source | Source: Ambulatory Visit | Attending: Obstetrics and Gynecology | Admitting: Obstetrics and Gynecology

## 2021-01-24 ENCOUNTER — Other Ambulatory Visit: Payer: Self-pay

## 2021-01-24 VITALS — BP 122/76 | Wt 131.1 lb

## 2021-01-24 DIAGNOSIS — Z1211 Encounter for screening for malignant neoplasm of colon: Secondary | ICD-10-CM

## 2021-01-24 DIAGNOSIS — Z1231 Encounter for screening mammogram for malignant neoplasm of breast: Secondary | ICD-10-CM

## 2021-01-24 DIAGNOSIS — Z1239 Encounter for other screening for malignant neoplasm of breast: Secondary | ICD-10-CM

## 2021-01-24 NOTE — Progress Notes (Signed)
Ms. Nicole Barrera is a 60 y.o. female who presents to [facility] clinic today with no complaints.    Pap Smear: Pap smear not completed today. Last Pap smear was 03/09/2018 at [facility] clinic and was normal with negative HPV. Per patient has history of an abnormal Pap smear 02/13/2010 that was LGSIL the no follow-up was completed. Patient's last Pap smear is the only Pap smear she has had completed since the abnormal. Last Pap smear result is available in Epic.   Physical exam: Breasts Breasts symmetrical. No skin abnormalities bilateral breasts. No nipple retraction bilateral breasts. No nipple discharge bilateral breasts. No lymphadenopathy. No lumps palpated bilateral breasts. No complaints of pain or tenderness on exam.  MS DIGITAL SCREENING TOMO BILATERAL  Result Date: 09/16/2019 CLINICAL DATA:  Screening. EXAM: DIGITAL SCREENING BILATERAL MAMMOGRAM WITH TOMO AND CAD COMPARISON:  Previous exam(s). ACR Breast Density Category c: The breast tissue is heterogeneously dense, which may obscure small masses. FINDINGS: There are no findings suspicious for malignancy. Images were processed with CAD. IMPRESSION: No mammographic evidence of malignancy. A result letter of this screening mammogram will be mailed directly to the patient. RECOMMENDATION: Screening mammogram in one year. (Code:SM-B-01Y) BI-RADS CATEGORY  1: Negative. Electronically Signed   By: Lovey Newcomer M.D.   On: 09/16/2019 12:41   MS DIGITAL SCREENING TOMO BILATERAL  Result Date: 03/10/2018 CLINICAL DATA:  Screening. EXAM: DIGITAL SCREENING BILATERAL MAMMOGRAM WITH TOMO AND CAD COMPARISON:  Previous exam(s). ACR Breast Density Category c: The breast tissue is heterogeneously dense, which may obscure small masses. FINDINGS: There are no findings suspicious for malignancy. Images were processed with CAD. IMPRESSION: No mammographic evidence of malignancy. A result letter of this screening mammogram will be mailed directly to the patient.  RECOMMENDATION: Screening mammogram in one year. (Code:SM-B-01Y) BI-RADS CATEGORY  1: Negative. Electronically Signed   By: Lajean Manes M.D.   On: 03/10/2018 10:11   MS DIGITAL SCREENING TOMO BILATERAL  Result Date: 05/09/2016 CLINICAL DATA:  Screening. EXAM: 2D DIGITAL SCREENING BILATERAL MAMMOGRAM WITH CAD AND ADJUNCT TOMO COMPARISON:  None. ACR Breast Density Category c: The breast tissue is heterogeneously dense, which may obscure small masses FINDINGS: There are no findings suspicious for malignancy. Images were processed with CAD. IMPRESSION: No mammographic evidence of malignancy. A result letter of this screening mammogram will be mailed directly to the patient. RECOMMENDATION: Screening mammogram in one year. (Code:SM-B-01Y) BI-RADS CATEGORY  1: Negative. Electronically Signed   By: Fidela Salisbury M.D.   On: 05/09/2016 12:29         Pelvic/Bimanual Pap smear is due today due to patient has only had one normal Pap smear since abnormal. Patient refused Pap smear today. Patient scheduled for Pap smear at the free cervical cancer screening clinic Wednesday, April 17, 2020 at [place].   Smoking History: Patient is a current smoker. Discussed smoking cessation with patient. Referred to the [facility] Quitline and gave resources to the free smoking cessation classes at [facility].   Patient Navigation: Patient education provided. Access to services provided for patient through [facility] program. Transportation provided to [employer] appointment and home following appointment.  Colorectal Cancer Screening: Per patient has had colonoscopy completed on 09/01/2018 at [place]. FIT Test given to patient today to complete.  No complaints today.    Breast and Cervical Cancer Risk Assessment: Patient has a family history of her mother and sister having breast cancer. Patient has no known genetic mutations or history of radiation treatment to the chest before  age 88. Patient has no history of cervical dysplasia,  immunocompromised, or DES exposure in-utero.  Risk Assessment     Risk Scores       01/24/2021 03/09/2018   Last edited by: Royston Bake, CMA Laelia Angelo, Heath Gold, RN   5-year risk: 3.7 % 3.6 %   Lifetime risk: 16.7 % 18 %            A: [facility] exam without pap smear No complaints.  P: Referred patient to the [facility] for a screening mammogram on mobile unit. Appointment scheduled Thursday, January 24, 2021 at 1030.  Loletta Parish, RN 01/24/2021 9:24 AM

## 2021-01-24 NOTE — Patient Instructions (Addendum)
Explained breast self awareness with Vaishali M Arias. Pap smear is due today due to patient has only had one normal Pap smear since abnormal. Patient refused Pap smear today. Patient scheduled for Pap smear at the free cervical cancer screening clinic Wednesday, April 17, 2020 at [place]. Referred patient to the [facility] for a screening mammogram on mobile unit. Appointment scheduled Thursday, January 24, 2021 at 1030. Patient escorted to the mobile unit following [facility] appointment for her screening mammogram. Let patient know the [facility] will follow up with her within the next couple weeks with results of her mammogram by letter or phone. Discussed smoking cessation with patient. Referred to the [facility] Quitline and gave resources to the free smoking cessation classes at [facility]ine M Enrico verbalized understanding.  Endia Moncur, Arvil Chaco, RN 9:24 AM

## 2021-03-11 ENCOUNTER — Other Ambulatory Visit: Payer: Self-pay | Admitting: Family Medicine

## 2021-03-11 DIAGNOSIS — I10 Essential (primary) hypertension: Secondary | ICD-10-CM

## 2021-04-17 ENCOUNTER — Ambulatory Visit: Payer: No Typology Code available for payment source

## 2021-06-09 ENCOUNTER — Other Ambulatory Visit: Payer: Self-pay | Admitting: Family Medicine

## 2021-06-09 DIAGNOSIS — I10 Essential (primary) hypertension: Secondary | ICD-10-CM

## 2021-06-30 ENCOUNTER — Emergency Department (HOSPITAL_COMMUNITY)
Admission: EM | Admit: 2021-06-30 | Discharge: 2021-07-01 | Disposition: A | Discharge status: Home / Self Care | Payer: No Typology Code available for payment source | Attending: Emergency Medicine | Admitting: Emergency Medicine

## 2021-06-30 ENCOUNTER — Encounter (HOSPITAL_COMMUNITY): Payer: Self-pay | Admitting: Emergency Medicine

## 2021-06-30 ENCOUNTER — Emergency Department (HOSPITAL_COMMUNITY): Payer: No Typology Code available for payment source

## 2021-06-30 DIAGNOSIS — R61 Generalized hyperhidrosis: Secondary | ICD-10-CM | POA: Insufficient documentation

## 2021-06-30 DIAGNOSIS — I10 Essential (primary) hypertension: Secondary | ICD-10-CM | POA: Insufficient documentation

## 2021-06-30 DIAGNOSIS — R55 Syncope and collapse: Secondary | ICD-10-CM | POA: Insufficient documentation

## 2021-06-30 DIAGNOSIS — Z79899 Other long term (current) drug therapy: Secondary | ICD-10-CM | POA: Insufficient documentation

## 2021-06-30 DIAGNOSIS — E876 Hypokalemia: Secondary | ICD-10-CM | POA: Insufficient documentation

## 2021-06-30 DIAGNOSIS — F17200 Nicotine dependence, unspecified, uncomplicated: Secondary | ICD-10-CM | POA: Insufficient documentation

## 2021-06-30 LAB — URINALYSIS, ROUTINE W REFLEX MICROSCOPIC
Bilirubin Urine: NEGATIVE
Glucose, UA: NEGATIVE mg/dL
Hgb urine dipstick: NEGATIVE
Ketones, ur: NEGATIVE mg/dL
Nitrite: NEGATIVE
Protein, ur: NEGATIVE mg/dL
Specific Gravity, Urine: 1.031 — ABNORMAL HIGH (ref 1.005–1.030)
pH: 5 (ref 5.0–8.0)

## 2021-06-30 LAB — MAGNESIUM: Magnesium: 2 mg/dL (ref 1.7–2.4)

## 2021-06-30 LAB — D-DIMER, QUANTITATIVE: D-Dimer, Quant: 3.32 ug/mL-FEU — ABNORMAL HIGH (ref 0.00–0.50)

## 2021-06-30 LAB — CBC WITH DIFFERENTIAL/PLATELET
Abs Immature Granulocytes: 0.02 10*3/uL (ref 0.00–0.07)
Basophils Absolute: 0 10*3/uL (ref 0.0–0.1)
Basophils Relative: 0 %
Eosinophils Absolute: 0 10*3/uL (ref 0.0–0.5)
Eosinophils Relative: 0 %
HCT: 30 % — ABNORMAL LOW (ref 36.0–46.0)
Hemoglobin: 9.7 g/dL — ABNORMAL LOW (ref 12.0–15.0)
Immature Granulocytes: 0 %
Lymphocytes Relative: 12 %
Lymphs Abs: 0.8 10*3/uL (ref 0.7–4.0)
MCH: 28.7 pg (ref 26.0–34.0)
MCHC: 32.3 g/dL (ref 30.0–36.0)
MCV: 88.8 fL (ref 80.0–100.0)
Monocytes Absolute: 0.3 10*3/uL (ref 0.1–1.0)
Monocytes Relative: 5 %
Neutro Abs: 5.5 10*3/uL (ref 1.7–7.7)
Neutrophils Relative %: 83 %
Platelets: 297 10*3/uL (ref 150–400)
RBC: 3.38 MIL/uL — ABNORMAL LOW (ref 3.87–5.11)
RDW: 15.5 % (ref 11.5–15.5)
WBC: 6.6 10*3/uL (ref 4.0–10.5)
nRBC: 0 % (ref 0.0–0.2)

## 2021-06-30 LAB — COMPREHENSIVE METABOLIC PANEL
ALT: 10 U/L (ref 0–44)
AST: 18 U/L (ref 15–41)
Albumin: 3.4 g/dL — ABNORMAL LOW (ref 3.5–5.0)
Alkaline Phosphatase: 61 U/L (ref 38–126)
Anion gap: 6 (ref 5–15)
BUN: 12 mg/dL (ref 6–20)
CO2: 25 mmol/L (ref 22–32)
Calcium: 8.5 mg/dL — ABNORMAL LOW (ref 8.9–10.3)
Chloride: 105 mmol/L (ref 98–111)
Creatinine, Ser: 1.15 mg/dL — ABNORMAL HIGH (ref 0.44–1.00)
GFR, Estimated: 55 mL/min — ABNORMAL LOW (ref >60)
Glucose, Bld: 93 mg/dL (ref 70–99)
Potassium: 2.9 mmol/L — ABNORMAL LOW (ref 3.5–5.1)
Sodium: 136 mmol/L (ref 135–145)
Total Bilirubin: 0.4 mg/dL (ref 0.3–1.2)
Total Protein: 9.1 g/dL — ABNORMAL HIGH (ref 6.5–8.1)

## 2021-06-30 LAB — TROPONIN I (HIGH SENSITIVITY)
Troponin I (High Sensitivity): 13 ng/L (ref <18)
Troponin I (High Sensitivity): 14 ng/L (ref <18)

## 2021-06-30 LAB — CK: Total CK: 120 U/L (ref 38–234)

## 2021-06-30 LAB — CBG MONITORING, ED: Glucose-Capillary: 86 mg/dL (ref 70–99)

## 2021-06-30 MED ORDER — SODIUM CHLORIDE 0.9 % IV BOLUS
1000.0000 mL | Freq: Once | INTRAVENOUS | Status: AC
  Administered 2021-06-30: 1000 mL via INTRAVENOUS

## 2021-06-30 MED ORDER — IOHEXOL 350 MG/ML SOLN
70.0000 mL | Freq: Once | INTRAVENOUS | Status: AC | PRN
Start: 2021-06-30 — End: 2021-06-30
  Administered 2021-06-30: 70 mL via INTRAVENOUS

## 2021-06-30 MED ORDER — POTASSIUM CHLORIDE 10 MEQ/100ML IV SOLN
10.0000 meq | Freq: Once | INTRAVENOUS | Status: AC
Start: 2021-06-30 — End: 2021-07-01
  Administered 2021-07-01: 10 meq via INTRAVENOUS
  Filled 2021-06-30: qty 100

## 2021-06-30 MED ORDER — POTASSIUM CHLORIDE CRYS ER 20 MEQ PO TBCR
40.0000 meq | EXTENDED_RELEASE_TABLET | Freq: Once | ORAL | Status: AC
Start: 2021-06-30 — End: 2021-06-30
  Administered 2021-06-30: 40 meq via ORAL
  Filled 2021-06-30: qty 2

## 2021-06-30 NOTE — ED Provider Notes (Signed)
?[facility] ?Provider Note ? ? ?CSN: [id number] ?Arrival date & time: 06/30/21  1846 ? ?  ? ?History ? ?Chief Complaint  ?Patient presents with  ? Loss of Consciousness  ? ? ?Nicole Barrera is a 61 y.o. female. ? ?This is a 61 y.o. female with significant medical history as below, including pretension, peptic ulcer disease, hypokalemia who presents to the ED with complaint of syncope. ? 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  She was carrying some trays and felt like she was going to pass out.  She felt lightheaded, diaphoretic, tunnel vision.  She sat on the ground, did not believe that she passed out.  She felt nauseated afterward vomited a couple times and felt much better.  No chest pain or dyspnea.  No headache, no fever chills.  No head injury.  No seizure activity was reported. ? ?Reports in the past similar episode occurred when her potassium was low. ? ? 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    Comment:  Qualifier: Diagnosis of  By: Jorene Minors, Scott   ?No date: HTN (hypertension) ?11/19/2006: PEPTIC ULCER DISEASE ?    Comment:  Qualifier: Diagnosis of  By: Radene Ou MD, [place]   ?No date: Tobacco abuse ? ?No past surgical history on file.  ? ? ?The history is provided by the patient. No language interpreter was used.  ?Loss of Consciousness ?Associated symptoms: nausea and vomiting   ?Associated symptoms: no chest pain, no confusion, no fever, no headaches, no palpitations and no shortness of breath   ? ?  ? ?Home Medications ?Prior to Admission medications   ?Medication Sig Start Date End Date Taking? Authorizing Provider  ?amLODipine (NORVASC) 10 MG tablet Take 1 tablet by mouth once daily ?Patient taking differently: Take 10 mg by mouth daily. 12/11/20  Yes Simmons-Robinson, Makiera, MD  ?hydrochlorothiazide (HYDRODIURIL) 25 MG tablet Take 1 tablet by mouth once daily ?Patient taking  differently: Take 25 mg by mouth daily. 03/11/21  Yes Simmons-Robinson, Riki Sheer, MD  ?   ? ?Allergies    ?Lisinopril   ? ?Review of Systems   ?Review of Systems  ?Constitutional:  Negative for chills and fever.  ?HENT:  Negative for facial swelling and trouble swallowing.   ?Eyes:  Negative for photophobia and visual disturbance.  ?Respiratory:  Negative for cough and shortness of breath.   ?Cardiovascular:  Positive for syncope. Negative for chest pain and palpitations.  ?Gastrointestinal:  Positive for nausea and vomiting. Negative for abdominal pain.  ?Endocrine: Negative for polydipsia and polyuria.  ?Genitourinary:  Negative for difficulty urinating and hematuria.  ?Musculoskeletal:  Negative for gait problem and joint swelling.  ?Skin:  Negative for pallor and rash.  ?Neurological:  Negative for headaches.  ?     Near syncope  ?Psychiatric/Behavioral:  Negative for agitation and confusion.   ? ?Physical Exam ?Updated Vital Signs ?BP 120/71   Pulse 64   Resp 17   SpO2 96%  ?Physical Exam ?Vitals and nursing note reviewed.  ?Constitutional:   ?   General: She is not in acute distress. ?   Appearance: Normal appearance. She is not diaphoretic.  ?HENT:  ?   Head: Normocephalic and atraumatic. No raccoon eyes, Battle's sign, right periorbital erythema or left periorbital erythema.  ?   Jaw: There is normal jaw occlusion. No trismus.  ?   Right Ear: External ear normal.  ?  Left Ear: External ear normal.  ?   Nose: Nose normal.  ?   Mouth/Throat:  ?   Mouth: Mucous membranes are moist.  ?Eyes:  ?   General: No scleral icterus.    ?   Right eye: No discharge.     ?   Left eye: No discharge.  ?   Extraocular Movements: Extraocular movements intact.  ?   Pupils: Pupils are equal, round, and reactive to light.  ?Cardiovascular:  ?   Rate and Rhythm: Normal rate and regular rhythm.  ?   Pulses: Normal pulses.  ?   Heart sounds: Normal heart sounds. No murmur heard. ?Pulmonary:  ?   Effort: Pulmonary effort is normal.  No respiratory distress.  ?   Breath sounds: Normal breath sounds.  ?Abdominal:  ?   General: Abdomen is flat.  ?   Tenderness: There is no abdominal tenderness.  ?Musculoskeletal:     ?   General: Normal range of motion.  ?   Cervical back: Normal range of motion.  ?   Right lower leg: No edema.  ?   Left lower leg: No edema.  ?Skin: ?   General: Skin is warm and dry.  ?   Capillary Refill: Capillary refill takes less than 2 seconds.  ?Neurological:  ?   Mental Status: She is alert and oriented to person, place, and time.  ?   GCS: GCS eye subscore is 4. GCS verbal subscore is 5. GCS motor subscore is 6.  ?   Cranial Nerves: Cranial nerves 2-12 are intact. No dysarthria or facial asymmetry.  ?   Sensory: Sensation is intact.  ?   Motor: Motor function is intact.  ?   Coordination: Coordination is intact.  ?   Gait: Gait is intact.  ?Psychiatric:     ?   Mood and Affect: Mood normal.     ?   Behavior: Behavior normal.  ? ? ?ED Results / Procedures / Treatments   ?Labs ?(all labs ordered are listed, but only abnormal results are displayed) ?Labs Reviewed  ?CBC WITH DIFFERENTIAL/PLATELET - Abnormal; Notable for the following components:  ?    Result Value  ? RBC 3.38 (*)   ? Hemoglobin 9.7 (*)   ? HCT 30.0 (*)   ? All other components within normal limits  ?COMPREHENSIVE METABOLIC PANEL - Abnormal; Notable for the following components:  ? Potassium 2.9 (*)   ? Creatinine, Ser 1.15 (*)   ? Calcium 8.5 (*)   ? Total Protein 9.1 (*)   ? Albumin 3.4 (*)   ? GFR, Estimated 55 (*)   ? All other components within normal limits  ?URINALYSIS, ROUTINE W REFLEX MICROSCOPIC - Abnormal; Notable for the following components:  ? APPearance HAZY (*)   ? Specific Gravity, Urine 1.031 (*)   ? Leukocytes,Ua TRACE (*)   ? Bacteria, UA RARE (*)   ? All other components within normal limits  ?D-DIMER, QUANTITATIVE - Abnormal; Notable for the following components:  ? D-Dimer, Quant 3.32 (*)   ? All other components within normal limits   ?MAGNESIUM  ?CK  ?CBG MONITORING, ED  ?TROPONIN I (HIGH SENSITIVITY)  ?TROPONIN I (HIGH SENSITIVITY)  ? ? ?EKG ?EKG Interpretation ? ?Date/Time:  Sunday June 30 2021 18:55:28 EDT ?Ventricular Rate:  87 ?PR Interval:  174 ?QRS Duration: 94 ?QT Interval:  383 ?QTC Calculation: 461 ?R Axis:   78 ?Text Interpretation: Sinus rhythm Artifact in lead(s) I II III aVR  aVL aVF V1 V2 Confirmed by Wynona Dove ([id number]) on 07/01/2021 12:50:41 AM ? ?Radiology ?CT Head Wo Contrast ? ?Result Date: 06/30/2021 ?CLINICAL DATA:  Syncope EXAM: CT HEAD WITHOUT CONTRAST TECHNIQUE: Contiguous axial images were obtained from the base of the skull through the vertex without intravenous contrast. RADIATION DOSE REDUCTION: This exam was performed according to the departmental dose-optimization program which includes automated exposure control, adjustment of the mA and/or kV according to patient size and/or use of iterative reconstruction technique. COMPARISON:  July 14, 2005 FINDINGS: Brain: No evidence of acute infarction, hemorrhage, hydrocephalus, extra-axial collection or mass lesion/mass effect. Vascular: No hyperdense vessel or unexpected calcification. Skull: Normal. Negative for fracture or focal lesion. Sinuses/Orbits: No acute finding. Other: None. IMPRESSION: No cause for syncope identified.  No acute abnormalities. Electronically Signed   By: Dorise Bullion III M.D.   On: 06/30/2021 20:02  ? ?CT Angio Chest PE W and/or Wo Contrast ? ?Result Date: 06/30/2021 ?CLINICAL DATA:  Syncope. EXAM: CT ANGIOGRAPHY CHEST WITH CONTRAST TECHNIQUE: Multidetector CT imaging of the chest was performed using the standard protocol during bolus administration of intravenous contrast. Multiplanar CT image reconstructions and MIPs were obtained to evaluate the vascular anatomy. RADIATION DOSE REDUCTION: This exam was performed according to the departmental dose-optimization program which includes automated exposure control, adjustment of the mA and/or kV  according to patient size and/or use of iterative reconstruction technique. CONTRAST:  42m OMNIPAQUE IOHEXOL 350 MG/ML SOLN COMPARISON:  None. FINDINGS: Cardiovascular: Satisfactory opacification of the pulmon

## 2021-06-30 NOTE — ED Triage Notes (Signed)
Pt here from home via [facility] for syncope/near syncope.  PT states she only had a few sips of pepsi today.  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  Family helped her to her seat.  She stated she heard family the entire time, but couldn't respond.  Pt vomited when she woke up but denies nausea presently.   ? ?Hx of hypokalemia.  Did not take K pills today. ? ?Cbg 120 ?Bp 130/82 ?Hr 90 ?Rr 16 ?Nsr on 12 lead ? ?18 G L ac ? ? ?

## 2021-07-01 MED ORDER — POTASSIUM CHLORIDE CRYS ER 20 MEQ PO TBCR: 20.0000 meq | EXTENDED_RELEASE_TABLET | Freq: Two times a day (BID) | ORAL | 0 refills | Status: DC

## 2021-07-01 NOTE — Discharge Instructions (Addendum)
Have someone stay with you until you feel stable. Do not drive, operate machinery, or play sports until your caregiver says it is okay. Keep all follow-up appointments as directed by your caregiver. Lie down right away if you start feeling like you might faint. Breathe deeply and steadily. Wait until all the symptoms have passed.Drink enough fluids to keep your urine clear or pale yellow. If you are taking blood pressure or heart medicine, get up slowly, taking several minutes to sit and then stand. This can reduce dizziness. SEEK IMMEDIATE MEDICAL CARE IF: You have a severe headache. You have unusual pain in the chest, abdomen, or back. You are bleeding from the mouth or rectum, or you have a black or tarry stool. You have an irregular or very fast heartbeat. You have pain with breathing. You have repeated fainting or seizure-like jerking during an episode. You faint when sitting or lying down. You have confusion. You have difficulty walking. You have severe weakness. You have vision problems. If you fainted, call your local emergency services - do not drive yourself to the hospital.   Please return to the emergency department immediately for any new or concerning symptoms, or if you get worse. 

## 2021-07-01 NOTE — ED Provider Notes (Signed)
?  Physical Exam  ?BP (!) 142/75   Pulse 72   Resp 18   SpO2 100%  ? ?Physical Exam ?Vitals and nursing note reviewed.  ?Constitutional:   ?   General: She is not in acute distress. ?   Appearance: She is well-developed. She is not diaphoretic.  ?HENT:  ?   Head: Normocephalic and atraumatic.  ?Cardiovascular:  ?   Heart sounds: No murmur heard. ?  No friction rub. No gallop.  ?Pulmonary:  ?   Effort: Pulmonary effort is normal.  ?Musculoskeletal:     ?   General: Normal range of motion.  ?   Cervical back: Normal range of motion and neck supple.  ?Skin: ?   General: Skin is warm and dry.  ?Neurological:  ?   General: No focal deficit present.  ?   Mental Status: She is alert and oriented to person, place, and time.  ? ? ?Procedures  ?Procedures ? ?ED Course / MDM  ?  ?Medical Decision Making ?Amount and/or Complexity of Data Reviewed ?Labs: ordered. ?Radiology: ordered. ? ?Risk ?Prescription drug management. ? ? ?Care assumed from Dr. Pearline Cables at shift change.  Patient presenting here after a near syncopal episode.  Patient's work-up is thus far unremarkable with the exception of a potassium of 2.9.  She has been observed for many hours and has remained stable with no additional episodes.  Potassium was replaced both orally and intravenously.  I feel as though patient can be discharged to home.  Cause of near syncope likely vasovagal.  To return as needed for any problems. ? ?And was advised to follow-up with her primary doctor later this week for a repeat metabolic panel. ? ? ? ? ?  ?Veryl Speak, MD ?07/01/21 0122 ? ?

## 2021-07-07 NOTE — Progress Notes (Deleted)
? ? ?  SUBJECTIVE:  ? ?CHIEF COMPLAINT / HPI: ED f/u for near syncope  ? ?Patient was evaluated in the ED on 06/30/21 for near syncope. She was noted to have hypokalemia of 2.9 which was repleted at that time.  ?Today she reports *** ? ? ?PERTINENT  PMH / PSH:  ?HTN  ?Tobacco Use  ?Systolic murmur  ?Hypokalemia  ?+FIT hx  ? ?OBJECTIVE:  ? ?There were no vitals taken for this visit.  ?Physical Exam ? ? ?ASSESSMENT/PLAN:  ? ?No problem-specific Assessment & Plan notes found for this encounter. ?  ? ? ?Eulis Foster, MD ?[place]  ?

## 2021-07-08 ENCOUNTER — Ambulatory Visit: Payer: No Typology Code available for payment source | Admitting: Family Medicine

## 2021-07-11 ENCOUNTER — Other Ambulatory Visit: Payer: Self-pay

## 2021-07-11 ENCOUNTER — Ambulatory Visit (INDEPENDENT_AMBULATORY_CARE_PROVIDER_SITE_OTHER): Payer: Self-pay | Admitting: Family Medicine

## 2021-07-11 VITALS — BP 125/73 | HR 91 | Wt 127.4 lb

## 2021-07-11 DIAGNOSIS — I10 Essential (primary) hypertension: Secondary | ICD-10-CM

## 2021-07-11 DIAGNOSIS — Z87898 Personal history of other specified conditions: Secondary | ICD-10-CM

## 2021-07-11 DIAGNOSIS — E876 Hypokalemia: Secondary | ICD-10-CM

## 2021-07-11 DIAGNOSIS — Z09 Encounter for follow-up examination after completed treatment for conditions other than malignant neoplasm: Secondary | ICD-10-CM

## 2021-07-11 NOTE — Progress Notes (Deleted)
? ? ?  SUBJECTIVE:  ? ?CHIEF COMPLAINT / HPI:  ? ?*** ? ?PERTINENT  PMH / PSH: *** ? ?OBJECTIVE:  ? ?BP 125/73   Pulse 91   Wt 127 lb 6.4 oz (57.8 kg)   SpO2 100%   BMI 21.87 kg/m?   ?*** ? ?ASSESSMENT/PLAN:  ? ?No problem-specific Assessment & Plan notes found for this encounter. ?  ? ? ?Jemeka Wagler Autry-Lott, DO ?[place]  ?

## 2021-07-11 NOTE — Patient Instructions (Addendum)
It was wonderful to see you today. ? ?Please bring ALL of your medications with you to every visit.  ? ?Today we talked about: ? ?Your recent hospital visit.  Today we will recheck your potassium levels.  We will also schedule you for an echo, we will call you when this is scheduled.  Follow-up with Dr. Alba Cory for your physical and Pap.  We also discussed decreasing down to 5 cigarettes from a 10, you can do this.  For help you can also reach out to 1-800-quit NOW. ? ?Please be sure to schedule follow up at the front  desk before you leave today.  ? ?If you haven't already, sign up for My Chart to have easy access to your labs results, and communication with your primary care physician. ? ?Please call the clinic at [phone] if your symptoms worsen or you have any concerns. It was our pleasure to serve you. ? ?Dr. Janus Molder ? ?

## 2021-07-11 NOTE — Progress Notes (Signed)
? ? ?  SUBJECTIVE:  ? ?CHIEF COMPLAINT / HPI:  ? ?Hospital follow up ?Seen in ED 4/2 for fainting.  States that her potassium was low at the time.  She would like this rechecked today.  Otherwise she is feeling well.  She has been taking 5 mg of potassium of her husband's medication since 4/2.  She denies chest pain, dizziness, shortness of breath.  She is taking hydrochlorothiazide for her blood pressure management. ? ?Tobacco use disorder ?Currently smoking 10 cigarettes daily.  Has attempted to quit in the past.  Plans to cut back to 5 cigarettes.  States that she has no issue with cutting back that she chooses Pepsi over smoking a lot of the times.  ? ?PERTINENT  PMH / PSH: HTN, history of palpitations, history of hypokalemia ? ?OBJECTIVE:  ? ?BP 125/73   Pulse 91   Wt 127 lb 6.4 oz (57.8 kg)   SpO2 100%   BMI 21.87 kg/m?   ?Physical Exam ?Vitals reviewed.  ?Constitutional:   ?   General: She is not in acute distress. ?   Appearance: She is not ill-appearing, toxic-appearing or diaphoretic.  ?Cardiovascular:  ?   Rate and Rhythm: Normal rate and regular rhythm.  ?Pulmonary:  ?   Effort: Pulmonary effort is normal.  ?   Breath sounds: Normal breath sounds.  ?Musculoskeletal:  ?   Right lower leg: No edema.  ?   Left lower leg: No edema.  ?Neurological:  ?   General: No focal deficit present.  ?   Mental Status: She is alert and oriented to person, place, and time. Mental status is at baseline.  ?Psychiatric:     ?   Mood and Affect: Mood normal.     ?   Behavior: Behavior normal.  ? ?ASSESSMENT/PLAN:  ? ?Hospital discharge follow-up ?Seen in ED on 4/2 for near syncope.  Reviewed visit.  Work-up grossly unremarkable except for hypokalemia to 2.9.  Today patient states she feels well. ? ?History of syncope ?Reviewed work-up by ED 4/2.  For history of palpitations and near syncope will obtain outpatient echocardiogram.  Patient asymptomatic today. ?- ECHOCARDIOGRAM COMPLETE; Future ? ?Hypokalemia ?Denies symptoms  today.  K 2.9 on 4/2.  Patient is taking unprescribed 5 mg of hypokalemia from her husband.  We will obtain BMP today. ?- Basic Metabolic Panel ? ?Primary hypertension ?Normotensive today.  At this time can continue hydrochlorothiazide but can consider decreasing or discontinuing depending on continued hypokalemia. ? ?Rylann Munford Autry-Lott, DO ?[place]  ?

## 2021-07-12 LAB — BASIC METABOLIC PANEL
BUN/Creatinine Ratio: 14 (ref 12–28)
BUN: 13 mg/dL (ref 8–27)
CO2: 24 mmol/L (ref 20–29)
Calcium: 9.1 mg/dL (ref 8.7–10.3)
Chloride: 108 mmol/L — ABNORMAL HIGH (ref 96–106)
Creatinine, Ser: 0.91 mg/dL (ref 0.57–1.00)
Glucose: 81 mg/dL (ref 70–99)
Potassium: 4.2 mmol/L (ref 3.5–5.2)
Sodium: 142 mmol/L (ref 134–144)
eGFR: 72 mL/min/{1.73_m2} (ref >59)

## 2021-07-14 NOTE — Progress Notes (Deleted)
? ? ?  SUBJECTIVE:  ? ?CHIEF COMPLAINT / HPI: f/u for hypokalemia and BP  ? ?Patient recently diagnosed with hypokalemia. BMP on 4/13 showed normal levels at 4.3 from previously 2.9. Patient was taking husband's potassium at home. She also takes hctz for BP management.  ?An echocardiogram was ordered at the recent visit given her syncope. She reports this is *** scheduled for ***.  ? ? ?HM  ?Patient is due for pap smear  ?Patient recommended for Shingles vaccine and tetanus vaccine ? ?PERTINENT  PMH / PSH:  ?HTN  ? ?OBJECTIVE:  ? ?There were no vitals taken for this visit.  ?Physical Exam ? ? ?ASSESSMENT/PLAN:  ? ?No problem-specific Assessment & Plan notes found for this encounter. ?  ? ? ?Eulis Foster, MD ?[place]  ?

## 2021-07-15 ENCOUNTER — Ambulatory Visit: Payer: No Typology Code available for payment source | Admitting: Family Medicine

## 2021-07-18 ENCOUNTER — Ambulatory Visit (INDEPENDENT_AMBULATORY_CARE_PROVIDER_SITE_OTHER): Payer: Self-pay | Admitting: Family Medicine

## 2021-07-18 ENCOUNTER — Encounter: Payer: Self-pay | Admitting: Family Medicine

## 2021-07-18 VITALS — BP 95/68 | HR 117 | Wt 126.0 lb

## 2021-07-18 DIAGNOSIS — R55 Syncope and collapse: Secondary | ICD-10-CM

## 2021-07-18 DIAGNOSIS — R634 Abnormal weight loss: Secondary | ICD-10-CM

## 2021-07-18 MED ORDER — NYSTATIN 100000 UNIT/ML MT SUSP: 4.0000 mL | Freq: Four times a day (QID) | OROMUCOSAL | 0 refills | Status: AC

## 2021-07-18 NOTE — Patient Instructions (Addendum)
It was nice to meet you today! ? ?Two main issues we need to sort through: ? ?Why you are losing weight and have decreased appetite ?Why you are passing out ? ?I suspect the passing out is due to poor appetite and weight loss. ? ?Keep appointment for heart ultrasound ?Checking a lot of labwork today ?Stop blood pressure medications for now ? ?Sent in nystatin for you to use for thrush ? ?Follow up with Dr. Alba Cory as scheduled ? ?Be well, ?Dr. Ardelia Mems  ?

## 2021-07-18 NOTE — Progress Notes (Signed)
?Date of Visit: 07/18/2021  ? ?SUBJECTIVE:  ? ?HPI: ? ?Nicole Barrera presents today for a same-day visit for syncope. She is accompanied by her daughter Nicole Barrera who helps provide some of the history.  ? ?Syncope - this morning got up like usual, was headed into kitchen to get something to [place] when she began to feel nauseated and lightheaded like she was going to pass out. Was standing in kitchen and next thing she knew she was on the floor with her husband by her side. It may have been several minutes before husband was able to be there with her. She was incontinent of urine. Did not bite tongue. Muscles are not currently sore. She now feels back to normal. Did not hit her head today, but did hit hand on table and L knee on floor, has abrasion there. ? ?Notably for the last few days has not had much to eat or drink, due to decreased appetite. She had a syncopal episode that was witnessed on 4/2, presented to ED and had workup including EKG, labs, CT head, CXR, and CTA chest. That workup was remarkable for K of 2.9 which was repleted in ED, also Hgb of 9.7 which is patient's baseline. She followed up here at the [facility] and an echo was ordered, which is scheduled on 4/25. K had normalized at that visit. Does not drive. ? ?She typically takes amlodipine '10mg'$  daily and HCTZ '25mg'$  daily for hypertension, but did not take this yesterday or today because she was feeling somewhat lightheaded. ? ?This all occurs on a background of progressive unintentional weight loss. Has decreased appetite and has not eaten much, but even before losing appetite she was eating a lot and still losing weight. Denies changes in bowel habits, blood in stool, abdominal pain, chest pain, or shortness of breath. No known fevers. Does endorse onset of night sweats which occur not every night, that began about a year ago. Had previously had night sweats while going through menopause but they had stopped for years and recently  returned. Has smoked for 40 years, about 1/2 ppd.  ? ?Has never noticed white material on her tongue before. ? ?PMHx of hypertension, tobacco use, venous stasis dermatitis, anemia, prior positive FIT test with subsequent normal colonoscopy, peptic ulcer disease ? ? ?OBJECTIVE:  ? ?BP 95/68   Pulse (!) 117   Wt 126 lb (57.2 kg)   SpO2 99%   BMI 21.63 kg/m?  ?Gen: no acute distress, pleasant, cooperative ?HEENT: normocephalic, atraumatic. Pupils equal round and reactive to light. Extraocular movements intact. + temporal wasting bilaterally. Mucous membranes mildly dry, with white exudate on tongue. ?Heart: tachycardic, regular rhythm. No murmurs. 2+ radial pulses bilaterally ?Lungs: clear to auscultation bilaterally, normal work of breathing  ?Abdomen: soft, nontender to palpation, no masses or organomegaly appreciated ?Neuro: cranial nerves II-XII tested and intact. Speech normal. Full strength bilat upper and lower ext. Normal FNF.  ?Lymph: No anterior cervical or supraclavicular lymphadenopathy. No axillary or inguinal lymphadenopathy appreciated. ?Ext: No appreciable lower extremity edema bilaterally. + quarter-sized abrasion to left anterior knee without swelling. Mild scratch to L hand. ? ?Orthostatics today: ?Lying  113/76 p111 ?Sitting 100/70  p116 ?Standing 102/71 p122 ? ?ASSESSMENT/PLAN:  ? ?Syncope ?Suspect related to dehydration in setting of poor PO intake for the last several days. ?EKG today shows sinus tachycardia with heart rate of 103, no ST changes or arrhythmias.  ?Has upcoming echo scheduled, which will be important given recurrent syncope. ?Normal neurological exam,  and patient reports she is now feeling back to normal, doubt intracranial process at this time. Patient was able to drink water here in the clinic and plans to get food after the visit ends. ?Plan:  ?- encouraged PO intake even if not feeling hungry ?- checking labs as below ?- keep appointment for echo ?- use caution with  ambulation ?- STOP antihypertensives given relative hypotension and syncope despite not taking medications in last 2 days ?- reviewed return precautions with patient and daughter  ? ?Unintentional weight loss ?This is perhaps the greater background issue. Review of weights shows 20lb weight loss since February 2021. Exam remarkable for temporal wasting and white exudate on tongue, suspicious for thrush. Patient endorsing ongoing intermittent night sweats. Differential diagnosis includes malignancy (extensive smoking history noted), infection (HIV, TB), hyperthyroidism. ? ?Status of age appropriate cancer screenings: ?- colon - current, normal colonoscopy in 2020 after positive FIT test, plan to repeat in 2030 ?- lung - current, would have qualified for low dose CT but had CTA done in ED which showed no concerning findings, no need for further scans today ?- cervical - has appointment this coming Monday for pap smear with PCP. Previously had LSIL in 2011 (unclear if had colpo after this), followed by normal cytology with negative HPV in December 2019. ?- breast - current on mammogram, normal in Oct 2022 ? ?Plan: ?- labs today: CBC with diff, CMET, iron studies, TSH/T3/free T4, HIV antibody, quant gold ?- keep appointment with PCP on Monday for follow up and pap smear ?- rx nystatin swish & swallow to treat thrush ?- advised need for regular follow up over multiple visits to continue evaluating this issue ? ?FOLLOW UP: ?Follow up in 4 days as scheduled with PCP for pap smear ? ?[place]. Ardelia Mems, MD ?[facility] ?

## 2021-07-21 NOTE — Progress Notes (Signed)
? ? ?SUBJECTIVE:  ? ?CHIEF COMPLAINT / HPI: Follow-up for syncope, Pap smear ? ?Syncope ?She denies any further episodes. She reports that her appetite has improved. She denies feeling dizziness or lightheadedness. She states that she has an appt for echocardiogram tomorrow.  ? ?Left Knee Pain  ?Reports that she hurt her left leg and knee when she feel and passed out during syncopal episode. She reports that she fell in her kitchen and had been having pain for 1 week. She has been cleaning the scab with the peroxide. She has not been taking any medication as she does not like to take OTC medication. She reports having pain with walking. The knee is sore. She reports having some swelling that has since improved.  ? ?Pap smear ?Patient presents for pap smear. She reports that she has had colpscopy before. She would not like to have pap smear today.  ?Last pap completed 2019 with results showing no HPV and normal cytology.   ?Today patient denies vaginal discharge, pruritis, pain with intercourse, abnormal vaginal bleeding  ?She declines STI testing today.  ? ? ?PERTINENT  PMH / PSH:  ?LSIL, 2011 ?Normal pap  ? ?OBJECTIVE:  ? ?BP 128/75   Pulse 95   Wt 127 lb 3.2 oz (57.7 kg)   SpO2 100%   BMI 21.83 kg/m?   ?Physical Exam ?Constitutional:   ?   Appearance: Normal appearance.  ?Cardiovascular:  ?   Rate and Rhythm: Normal rate and regular rhythm.  ?Pulmonary:  ?   Effort: Pulmonary effort is normal.  ?   Breath sounds: No wheezing or rales.  ?Musculoskeletal:  ?   Right knee: Normal.  ?   Left knee: No swelling, effusion, erythema or crepitus. Tenderness present over the lateral joint line. No LCL laxity, MCL laxity, ACL laxity or PCL laxity.Normal pulse.  ?   Instability Tests: Anterior drawer test negative. Posterior drawer test negative. Anterior Lachman test negative.  ?   Comments: Healing abrasion on left patella and lateral knee   ?Neurological:  ?   Mental Status: She is alert.  ? ? ?ASSESSMENT/PLAN:   ? ?Primary hypertension ?Blood pressure within goal range.  Due to history of syncopal episodes recently, encourage patient to stop blood pressure medications. ?Given blood pressure is within goal range, will not restart these at this time. ? ?Healthcare maintenance ?Patient recommended for Pap smear.  Shared decision making completed today and patient would like to stop Pap smears.  Of note, last Pap smear in 2019 was normal.  Patient had L SIL in 2011 and reports having colposcopy after this.  Discussed recommendations for Pap smears and this could potentially be her last if everything was normal, patient states she would still like to not have a Pap smear as she is 61 years old. ?Updated patient's healthcare maintenance tab for discontinuation of Pap smears ?Recommended shingles vaccination, see AVS ? ?Iron deficiency ?Patient noted to have low iron on recent blood work ?Prescribed 325 mg daily ?Follow-up in 3 weeks ? ?Acute pain of left knee ?Suspect potential bone bruise from fall during syncopal episode, physical exam findings are not consistent with ligament injuries nor fracture as patient able to ambulate ?Healing abrasion noted on left knee patellar region ?Patient declines x-rays ?Given handout with conservative management tips ?Prescribe Voltaren gel ? ? ?Blood glucose elevated ?Blood glucose 104 during recent lab work ?Patient would like to have testing for diabetes ?Hemoglobin A1c ordered ?  ? ? ?Eulis Foster, MD ?[facility]  [place]  ?

## 2021-07-22 ENCOUNTER — Ambulatory Visit (INDEPENDENT_AMBULATORY_CARE_PROVIDER_SITE_OTHER): Payer: Self-pay | Admitting: Family Medicine

## 2021-07-22 ENCOUNTER — Other Ambulatory Visit: Payer: Self-pay

## 2021-07-22 ENCOUNTER — Encounter: Payer: Self-pay | Admitting: Family Medicine

## 2021-07-22 VITALS — BP 128/75 | HR 95 | Wt 127.2 lb

## 2021-07-22 DIAGNOSIS — Z0000 Encounter for general adult medical examination without abnormal findings: Secondary | ICD-10-CM

## 2021-07-22 DIAGNOSIS — R739 Hyperglycemia, unspecified: Secondary | ICD-10-CM

## 2021-07-22 DIAGNOSIS — I10 Essential (primary) hypertension: Secondary | ICD-10-CM

## 2021-07-22 DIAGNOSIS — E611 Iron deficiency: Secondary | ICD-10-CM

## 2021-07-22 DIAGNOSIS — M25562 Pain in left knee: Secondary | ICD-10-CM

## 2021-07-22 LAB — POCT GLYCOSYLATED HEMOGLOBIN (HGB A1C): Hemoglobin A1C: 5.4 % (ref 4.0–5.6)

## 2021-07-22 MED ORDER — DICLOFENAC SODIUM 1 % EX GEL: 4.0000 g | Freq: Four times a day (QID) | CUTANEOUS | 2 refills | Status: DC

## 2021-07-22 MED ORDER — FERROUS SULFATE 325 (65 FE) MG PO TBEC: 325.0000 mg | DELAYED_RELEASE_TABLET | Freq: Every day | ORAL | 3 refills | Status: DC

## 2021-07-22 NOTE — Assessment & Plan Note (Addendum)
Patient recommended for Pap smear.  Shared decision making completed today and patient would like to stop Pap smears.  Of note, last Pap smear in 2019 was normal.  Patient had L SIL in 2011 and reports having colposcopy after this.  Discussed recommendations for Pap smears and this could potentially be her last if everything was normal, patient states she would still like to not have a Pap smear as she is 61 years old. ?Updated patient's healthcare maintenance tab for discontinuation of Pap smears ?Recommended shingles vaccination, see AVS ?

## 2021-07-22 NOTE — Assessment & Plan Note (Signed)
Blood glucose 104 during recent lab work ?Patient would like to have testing for diabetes ?Hemoglobin A1c ordered ?

## 2021-07-22 NOTE — Assessment & Plan Note (Signed)
Blood pressure within goal range.  Due to history of syncopal episodes recently, encourage patient to stop blood pressure medications. ?Given blood pressure is within goal range, will not restart these at this time. ?

## 2021-07-22 NOTE — Assessment & Plan Note (Signed)
Patient noted to have low iron on recent blood work ?Prescribed 325 mg daily ?Follow-up in 3 weeks ?

## 2021-07-22 NOTE — Patient Instructions (Addendum)
We will check your hemoglobin A1c to evaluate for diabetes.  I will notify you of any abnormal results. ? ?You can continue to not take your blood pressure medications as your blood pressure is in normal range and we want to prevent any opportunities for increasing your risk of passout. ? ?Please follow-up as scheduled for your echocardiogram. ? ?We will discontinue your Pap smears as requested. ? ?Please follow up with me in 3 weeks.  ? ?For your knee, I have prescribed a gel you can apply. Please see the information below for other recommendations to help with the knee pain after your fall.  ? ? ? ?Acute Knee Pain, Adult ?Acute knee pain is sudden and may be caused by damage, swelling, or irritation of the muscles and tissues that support the knee. Pain may result from: ?A fall. ?An injury to the knee from twisting motions. ?A hit to the knee. ?Infection. ?Acute knee pain may go away on its own with time and rest. If it does not, your health care provider may order tests to find the cause of the pain. These may include: ?Imaging tests, such as an X-ray, MRI, CT scan, or ultrasound. ?Joint aspiration. In this test, fluid is removed from the knee and evaluated. ?Arthroscopy. In this test, a lighted tube is inserted into the knee and an image is projected onto a TV screen. ?Biopsy. In this test, a sample of tissue is removed from the body and studied under a microscope. ?Follow these instructions at home: ?If you have a knee sleeve or brace: ? ?Wear the knee sleeve or brace as told by your health care provider. Remove it only as told by your health care provider. ?Loosen it if your toes tingle, become numb, or turn cold and blue. ?Keep it clean. ?If the knee sleeve or brace is not waterproof: ?Do not let it get wet. ?Cover it with a watertight covering when you take a bath or shower. ?Activity ?Rest your knee. ?Do not do things that cause pain or make pain worse. ?Avoid high-impact activities or exercises, such as  running, jumping rope, or doing jumping jacks. ?Work with a physical therapist to make a safe exercise program, as recommended by your health care provider. Do exercises as told by your physical therapist. ?Managing pain, stiffness, and swelling ? ?If directed, put ice on the affected knee. To do this: ?If you have a removable knee sleeve or brace, remove it as told by your health care provider. ?Put ice in a plastic bag. ?Place a towel between your skin and the bag. ?Leave the ice on for 20 minutes, 2-3 times a day. ?Remove the ice if your skin turns bright red. This is very important. If you cannot feel pain, heat, or cold, you have a greater risk of damage to the area. ?If directed, use an elastic bandage to put pressure (compression) on your injured knee. This may control swelling, give support, and help with discomfort. ?Raise (elevate) your knee above the level of your heart while you are sitting or lying down. ?Sleep with a pillow under your knee. ?General instructions ?Take over-the-counter and prescription medicines only as told by your health care provider. ?Do not use any products that contain nicotine or tobacco, such as cigarettes, e-cigarettes, and chewing tobacco. If you need help quitting, ask your health care provider. ?If you are overweight, work with your health care provider and a dietitian to set a weight-loss goal that is healthy and reasonable for you. Extra  weight can put pressure on your knee. ?Pay attention to any changes in your symptoms. ?Keep all follow-up visits. This is important. ?Contact a health care provider if: ?Your knee pain continues, changes, or gets worse. ?You have a fever along with knee pain. ?Your knee feels warm to the touch or is red. ?Your knee buckles or locks up. ?Get help right away if: ?Your knee swells, and the swelling becomes worse. ?You cannot move your knee. ?You have severe pain in your knee that cannot be managed with pain medicine. ?Summary ?Acute knee pain  can be caused by a fall, an injury, an infection, or damage, swelling, or irritation of the tissues that support your knee. ?Your health care provider may perform tests to find out the cause of the pain. ?Pay attention to any changes in your symptoms. Relieve your pain with rest, medicines, light activity, and the use of ice. ?Get help right away if your knee swells, you cannot move your knee, or you have severe pain that cannot be managed with medicine. ?This information is not intended to replace advice given to you by your health care provider. Make sure you discuss any questions you have with your health care provider. ? ? ?

## 2021-07-22 NOTE — Assessment & Plan Note (Addendum)
Suspect potential bone bruise from fall during syncopal episode, physical exam findings are not consistent with ligament injuries nor fracture as patient able to ambulate ?Healing abrasion noted on left knee patellar region ?Patient declines x-rays ?Given handout with conservative management tips ?Prescribe Voltaren gel ? ?

## 2021-07-23 ENCOUNTER — Ambulatory Visit (HOSPITAL_COMMUNITY)
Admission: RE | Admit: 2021-07-23 | Discharge: 2021-07-23 | Disposition: A | Discharge status: Home / Self Care | Payer: Self-pay | Source: Ambulatory Visit | Attending: Family Medicine | Admitting: Family Medicine

## 2021-07-23 ENCOUNTER — Encounter: Payer: Self-pay | Admitting: Family Medicine

## 2021-07-23 DIAGNOSIS — I517 Cardiomegaly: Secondary | ICD-10-CM | POA: Insufficient documentation

## 2021-07-23 DIAGNOSIS — I10 Essential (primary) hypertension: Secondary | ICD-10-CM | POA: Insufficient documentation

## 2021-07-23 DIAGNOSIS — Z87898 Personal history of other specified conditions: Secondary | ICD-10-CM

## 2021-07-23 DIAGNOSIS — F17200 Nicotine dependence, unspecified, uncomplicated: Secondary | ICD-10-CM | POA: Insufficient documentation

## 2021-07-23 DIAGNOSIS — R55 Syncope and collapse: Secondary | ICD-10-CM | POA: Insufficient documentation

## 2021-07-23 LAB — ECHOCARDIOGRAM COMPLETE
Area-P 1/2: 4.31 cm2
S' Lateral: 2.6 cm

## 2021-07-24 LAB — CBC WITH DIFFERENTIAL/PLATELET
Basophils Absolute: 0 10*3/uL (ref 0.0–0.2)
Basos: 1 %
EOS (ABSOLUTE): 0.2 10*3/uL (ref 0.0–0.4)
Eos: 5 %
Hematocrit: 35.9 % (ref 34.0–46.6)
Hemoglobin: 12.4 g/dL (ref 11.1–15.9)
Immature Grans (Abs): 0 10*3/uL (ref 0.0–0.1)
Immature Granulocytes: 0 %
Lymphocytes Absolute: 2 10*3/uL (ref 0.7–3.1)
Lymphs: 46 %
MCH: 32.1 pg (ref 26.6–33.0)
MCHC: 34.5 g/dL (ref 31.5–35.7)
MCV: 93 fL (ref 79–97)
Monocytes Absolute: 0.5 10*3/uL (ref 0.1–0.9)
Monocytes: 12 %
Neutrophils Absolute: 1.5 10*3/uL (ref 1.4–7.0)
Neutrophils: 36 %
Platelets: 163 10*3/uL (ref 150–450)
RBC: 3.86 x10E6/uL (ref 3.77–5.28)
RDW: 12.7 % (ref 11.7–15.4)
WBC: 4.2 10*3/uL (ref 3.4–10.8)

## 2021-07-24 LAB — IRON,TIBC AND FERRITIN PANEL
Ferritin: 196 ng/mL — ABNORMAL HIGH (ref 15–150)
Iron Saturation: 6 % — CL (ref 15–55)
Iron: 11 ug/dL — ABNORMAL LOW (ref 27–159)
Total Iron Binding Capacity: 183 ug/dL — ABNORMAL LOW (ref 250–450)
UIBC: 172 ug/dL (ref 131–425)

## 2021-07-24 LAB — CMP14+EGFR
ALT: 16 IU/L (ref 0–32)
AST: 20 IU/L (ref 0–40)
Albumin/Globulin Ratio: 0.7 — ABNORMAL LOW (ref 1.2–2.2)
Albumin: 3.6 g/dL — ABNORMAL LOW (ref 3.8–4.9)
Alkaline Phosphatase: 63 IU/L (ref 44–121)
BUN/Creatinine Ratio: 10 — ABNORMAL LOW (ref 12–28)
BUN: 11 mg/dL (ref 8–27)
Bilirubin Total: 0.5 mg/dL (ref 0.0–1.2)
CO2: 20 mmol/L (ref 20–29)
Calcium: 8.2 mg/dL — ABNORMAL LOW (ref 8.7–10.3)
Chloride: 98 mmol/L (ref 96–106)
Creatinine, Ser: 1.09 mg/dL — ABNORMAL HIGH (ref 0.57–1.00)
Globulin, Total: 5.3 g/dL — ABNORMAL HIGH (ref 1.5–4.5)
Glucose: 104 mg/dL — ABNORMAL HIGH (ref 70–99)
Potassium: 3.5 mmol/L (ref 3.5–5.2)
Sodium: 132 mmol/L — ABNORMAL LOW (ref 134–144)
Total Protein: 8.9 g/dL — ABNORMAL HIGH (ref 6.0–8.5)
eGFR: 58 mL/min/{1.73_m2} — ABNORMAL LOW (ref >59)

## 2021-07-24 LAB — QUANTIFERON-TB GOLD PLUS
QuantiFERON Mitogen Value: 4.94 IU/mL
QuantiFERON Nil Value: 0 IU/mL
QuantiFERON TB1 Ag Value: 0.02 IU/mL
QuantiFERON TB2 Ag Value: 0.01 IU/mL
QuantiFERON-TB Gold Plus: NEGATIVE

## 2021-07-24 LAB — T4, FREE: Free T4: 1.39 ng/dL (ref 0.82–1.77)

## 2021-07-24 LAB — HIV ANTIBODY (ROUTINE TESTING W REFLEX): HIV Screen 4th Generation wRfx: NONREACTIVE

## 2021-07-24 LAB — T3: T3, Total: 82 ng/dL (ref 71–180)

## 2021-07-24 LAB — TSH: TSH: 2.18 u[IU]/mL (ref 0.450–4.500)

## 2021-07-26 ENCOUNTER — Encounter: Payer: Self-pay | Admitting: Family Medicine

## 2021-07-26 ENCOUNTER — Telehealth: Payer: Self-pay

## 2021-07-26 NOTE — Telephone Encounter (Signed)
Patient calls nurse line reporting elevated blood pressures.  ? ?Patient reports she was taken off amlodipine and HCTZ a couple of weeks ago due to syncope episodes.  ? ?Patient reports she had a headache yesterday which promoted her to take her BP. Patient reports 175/87. Patient reports she then took both amlodipine and HCTZ. Reports checked her BP ~1 hour later and 150/80. ? ?Patient reports she took only HCTZ this morning and checked BP ~ 1 hour later and 158/89.  ? ?Patient denies any current symptoms today. No headaches, SOB dizziness, chest pains or vision changes.  ? ?Patient has apt for 5/17 with PCP.  ? ?Patient would like to restart her BP medication.  ? ?Will forward to PCP.  ? ?Red flags discussed with patient.  ?

## 2021-07-30 NOTE — Telephone Encounter (Signed)
Responded to patient via MyChart.  ? ?Ok to restart amlodipine and HCTZ for BP. Advised patient to continue monitoring BP until next visit as scheduled. Reviewed ED precautions given recent syncope.  ? ?Eulis Foster, MD ?[place], PGY-3 ?[phone]  ? ?

## 2021-07-31 ENCOUNTER — Encounter: Payer: Self-pay | Admitting: Family Medicine

## 2021-08-07 ENCOUNTER — Other Ambulatory Visit: Payer: Self-pay | Admitting: Family Medicine

## 2021-08-07 DIAGNOSIS — I10 Essential (primary) hypertension: Secondary | ICD-10-CM

## 2021-08-15 ENCOUNTER — Encounter: Payer: Self-pay | Admitting: Family Medicine

## 2021-08-15 NOTE — Patient Instructions (Signed)
High Blood Pressure Your BP is well controlled Your goal is to have a BP average less than 140/90 Medicine Changes: none  Homework: continue to check blood pressure once daily   Call us if: persistently greater than 180/90    Come back to see [place] in: 10 days    For your hearing changes, I am referring you to an ear nose and throat specialist to have formal hearing testing. We will also start  you on a prednisone course for 10 days.

## 2021-08-15 NOTE — Progress Notes (Signed)
SUBJECTIVE:   CHIEF COMPLAINT / HPI: follow up HTN   Hypertension: - Medications: amlodipine and HCTZ  - Compliance: reports daily adherence  - Checking BP at home: yes  - Denies any SOB, CP, vision changes, LE edema, medication SEs, or symptoms of hypotension  Left Ear Humming  Patient reports humming sound in left ear for close to 2 weeks, her right ear was bothering her but has stopped  She reports that the sound is present mostly throughout the day, it does not keep her from sleeping  She states she has not had a fever  She reports that one week ago, her ear felt "stopped up" and was not as annoying  She denies that the ear is painful  She is not hearing a pulsatile sound  She has not had ear drainage  She reports frequent ear infections as a child  She states that her hearing is decreased since the humming started  She denies presence of HA  Reports that she constantly has to turn the volume of her TV to 40 to be able to hear   PERTINENT  PMH / PSH:  HTN  Tobacco use   OBJECTIVE:   BP 117/70   Pulse 72   Temp 98.1 F (36.7 C)   Ht '5\' 4"'$  (1.626 m)   Wt 125 lb 3.2 oz (56.8 kg)   SpO2 99%   BMI 21.49 kg/m   Physical Exam Constitutional:      Appearance: She is normal weight.  HENT:     Right Ear: Tympanic membrane, ear canal and external ear normal. No decreased hearing noted. No drainage or tenderness. There is no impacted cerumen. No mastoid tenderness. No hemotympanum. Tympanic membrane is not perforated, erythematous or bulging.     Left Ear: Tympanic membrane, ear canal and external ear normal. Decreased hearing noted. No drainage or tenderness. There is no impacted cerumen. No mastoid tenderness. No hemotympanum. Tympanic membrane is not perforated, erythematous or bulging.     Nose: Nose normal. No rhinorrhea.     Mouth/Throat:     Pharynx: Oropharynx is clear.  Eyes:     General: No scleral icterus.    Conjunctiva/sclera: Conjunctivae normal.      Pupils: Pupils are equal, round, and reactive to light.  Cardiovascular:     Rate and Rhythm: Normal rate and regular rhythm.     Pulses: Normal pulses.  Pulmonary:     Effort: Pulmonary effort is normal.     Breath sounds: No wheezing or rales.  Abdominal:     Palpations: Abdomen is soft.  Musculoskeletal:     Cervical back: Normal range of motion.  Skin:    General: Skin is warm.  Neurological:     General: No focal deficit present.     Mental Status: She is alert and oriented to person, place, and time.     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

## 2021-08-16 ENCOUNTER — Encounter: Payer: Self-pay | Admitting: Family Medicine

## 2021-08-16 ENCOUNTER — Ambulatory Visit (INDEPENDENT_AMBULATORY_CARE_PROVIDER_SITE_OTHER): Payer: Self-pay | Admitting: Family Medicine

## 2021-08-16 VITALS — BP 117/70 | HR 72 | Temp 98.1°F | Ht 64.0 in | Wt 125.2 lb

## 2021-08-16 DIAGNOSIS — I10 Essential (primary) hypertension: Secondary | ICD-10-CM

## 2021-08-16 DIAGNOSIS — H9072 Mixed conductive and sensorineural hearing loss, unilateral, left ear, with unrestricted hearing on the contralateral side: Secondary | ICD-10-CM | POA: Insufficient documentation

## 2021-08-16 MED ORDER — PREDNISONE 50 MG PO TABS
ORAL_TABLET | ORAL | 0 refills | Status: DC
Start: 2021-08-16 — End: 2021-08-19

## 2021-08-16 NOTE — Assessment & Plan Note (Addendum)
Patient with left-sided mixed conductive and sensorineural hearing loss Patient unable to hear with tuning fork AC or BC Patient does not have lateralization of hearing with tuning fork placed in the center of her head Normal physical examination of the ear today  we will refer to ENT for audiology evaluation Hearing test completed in office and patient able to hear bilaterally at 1500MHz

## 2021-08-16 NOTE — Assessment & Plan Note (Signed)
Blood pressure is well controlled today Patient to continue amlodipine and hydrochlorothiazide

## 2021-08-17 ENCOUNTER — Encounter: Payer: Self-pay | Admitting: Family Medicine

## 2021-08-19 ENCOUNTER — Telehealth: Payer: Self-pay

## 2021-08-19 MED ORDER — PREDNISONE 10 MG PO TABS: ORAL_TABLET | ORAL | 0 refills | Status: DC

## 2021-08-19 NOTE — Telephone Encounter (Signed)
Discontinued '50mg'$  dose and prescribed titrating dose for 10 day course starting with '10mg'$  and ending with '50mg'$ .

## 2021-08-19 NOTE — Telephone Encounter (Signed)
Patient calls nurse line in regards to Prednisone prescription.   Patient reports she does not feel comfortable taking '50mg'$  per day. Patient states, "this is just a me thing."   Patient is requesting to start off at a lower dose and titrate up.   Will forward to PCP.

## 2021-08-23 MED ORDER — PREDNISONE 20 MG PO TABS: 20.0000 mg | ORAL_TABLET | Freq: Every day | ORAL | 0 refills | Status: AC

## 2021-08-23 NOTE — Addendum Note (Signed)
Addended by: Adolph Pollack on: 08/23/2021 05:30 PM   Modules accepted: Orders

## 2021-08-23 NOTE — Telephone Encounter (Signed)
Cancelled uptitration of prednisone.  We will send 20 mg for 5-day course.  Spoke with pharmacy via telephone who is comfortable with proceeding with this prescription dose.   Eulis Foster, MD [place], PGY-3 [phone]

## 2021-08-23 NOTE — Telephone Encounter (Signed)
Pharmacist calls nurse line reporting they do not feel comfortable dispensing Prednisone the way its written.   Pharmacist reports Prednisone is titrated down and not up.   Please call the pharmacy to clarify with pharmacist or send in a new prescription.

## 2021-08-30 ENCOUNTER — Ambulatory Visit (INDEPENDENT_AMBULATORY_CARE_PROVIDER_SITE_OTHER): Payer: Self-pay | Admitting: Family Medicine

## 2021-08-30 ENCOUNTER — Encounter: Payer: Self-pay | Admitting: Family Medicine

## 2021-08-30 DIAGNOSIS — H9072 Mixed conductive and sensorineural hearing loss, unilateral, left ear, with unrestricted hearing on the contralateral side: Secondary | ICD-10-CM

## 2021-08-30 NOTE — Progress Notes (Signed)
     SUBJECTIVE:   CHIEF COMPLAINT / HPI:   Nicole Barrera is a 61 y.o. female presents for follow up   Hearing loss  Pt seen in clinic on 5/19 for hearing loss and tinnitus.  She was prescribed 10-day course of prednisone and referred to ENT for audiology evaluation.  Patient has been scared to take the prednisone as she has had of harmful side effects.  She comes in today as she has questions about the prednisone.  She already contacted the PCP and had the prednisone dose reduced to 20 mg.  She does not want to take a prolonged course of this.   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 Counseled on general side effects. She can take it in the morning to avoid insomnia. If she has any unwanted side effects she should stop it immediately. Follow up with ENT for audiology evaluation.    Lattie Haw, MD PGY-3 [place]

## 2021-08-30 NOTE — Patient Instructions (Signed)
Thank you for coming to see me today. It was a pleasure. Today we discussed your prednisone '20mg'$ , you can take this for 5 days and hopefully it helps with the tinnitus. I recommend follow up with ENT  If you have an allergic reaction then stop the steroids.   If you have any questions or concerns, please do not hesitate to call the office at [phone].  Best wishes,   Dr Posey Pronto

## 2021-08-31 NOTE — Assessment & Plan Note (Addendum)
Reassured patient that it is safe to take 20 mg prednisone for 5 days and it is not a high dose. Counseled on general side effects. She can take it in the morning to avoid insomnia. If she has any unwanted side effects she should stop it immediately. Follow up with ENT for audiology evaluation.

## 2021-09-03 ENCOUNTER — Encounter: Payer: Self-pay | Admitting: *Deleted

## 2021-09-05 ENCOUNTER — Encounter: Payer: Self-pay | Admitting: Family Medicine

## 2022-01-02 ENCOUNTER — Other Ambulatory Visit: Payer: Self-pay | Admitting: Obstetrics and Gynecology

## 2022-01-02 ENCOUNTER — Other Ambulatory Visit: Payer: Self-pay | Admitting: Family Medicine

## 2022-01-02 DIAGNOSIS — Z1231 Encounter for screening mammogram for malignant neoplasm of breast: Secondary | ICD-10-CM

## 2022-01-09 ENCOUNTER — Other Ambulatory Visit: Payer: Self-pay | Admitting: Family Medicine

## 2022-01-09 DIAGNOSIS — I10 Essential (primary) hypertension: Secondary | ICD-10-CM

## 2022-02-06 ENCOUNTER — Ambulatory Visit: Payer: Self-pay | Admitting: Hematology and Oncology

## 2022-02-06 ENCOUNTER — Ambulatory Visit
Admission: RE | Admit: 2022-02-06 | Discharge: 2022-02-06 | Disposition: A | Discharge status: Home / Self Care | Payer: No Typology Code available for payment source | Source: Ambulatory Visit | Attending: Obstetrics and Gynecology | Admitting: Obstetrics and Gynecology

## 2022-02-06 VITALS — BP 126/80 | Wt 126.8 lb

## 2022-02-06 DIAGNOSIS — Z122 Encounter for screening for malignant neoplasm of respiratory organs: Secondary | ICD-10-CM

## 2022-02-06 DIAGNOSIS — Z1231 Encounter for screening mammogram for malignant neoplasm of breast: Secondary | ICD-10-CM

## 2022-02-06 NOTE — Progress Notes (Addendum)
Ms. Lamoine Fredricksen Wirthlin is a 61 y.o. female who presents to [facility] clinic today with no complaints.    Pap Smear: Pap not smear completed today. Last Pap smear was 03/09/2018 at [facility] clinic and was normal. Per patient has no history of an abnormal Pap smear. Last Pap smear result is available in Epic.   Physical exam: Breasts Breasts symmetrical. No skin abnormalities bilateral breasts. No nipple retraction bilateral breasts. No nipple discharge bilateral breasts. No lymphadenopathy. No lumps palpated bilateral breasts.     MM 3D SCREEN BREAST BILATERAL  Result Date: 01/27/2021 CLINICAL DATA:  Screening. EXAM: DIGITAL SCREENING BILATERAL MAMMOGRAM WITH TOMOSYNTHESIS AND CAD TECHNIQUE: Bilateral screening digital craniocaudal and mediolateral oblique mammograms were obtained. Bilateral screening digital breast tomosynthesis was performed. The images were evaluated with computer-aided detection. COMPARISON:  Previous exam(s). ACR Breast Density Category c: The breast tissue is heterogeneously dense, which may obscure small masses. FINDINGS: There are no findings suspicious for malignancy. IMPRESSION: No mammographic evidence of malignancy. A result letter of this screening mammogram will be mailed directly to the patient. RECOMMENDATION: Screening mammogram in one year. (Code:SM-B-01Y) BI-RADS CATEGORY  1: Negative. Electronically Signed   By: Ammie Ferrier M.D.   On: 01/27/2021 09:23   MS DIGITAL SCREENING TOMO BILATERAL  Result Date: 09/16/2019 CLINICAL DATA:  Screening. EXAM: DIGITAL SCREENING BILATERAL MAMMOGRAM WITH TOMO AND CAD COMPARISON:  Previous exam(s). ACR Breast Density Category c: The breast tissue is heterogeneously dense, which may obscure small masses. FINDINGS: There are no findings suspicious for malignancy. Images were processed with CAD. IMPRESSION: No mammographic evidence of malignancy. A result letter of this screening mammogram will be mailed directly to the patient.  RECOMMENDATION: Screening mammogram in one year. (Code:SM-B-01Y) BI-RADS CATEGORY  1: Negative. Electronically Signed   By: Lovey Newcomer M.D.   On: 09/16/2019 12:41   MS DIGITAL SCREENING TOMO BILATERAL  Result Date: 03/10/2018 CLINICAL DATA:  Screening. EXAM: DIGITAL SCREENING BILATERAL MAMMOGRAM WITH TOMO AND CAD COMPARISON:  Previous exam(s). ACR Breast Density Category c: The breast tissue is heterogeneously dense, which may obscure small masses. FINDINGS: There are no findings suspicious for malignancy. Images were processed with CAD. IMPRESSION: No mammographic evidence of malignancy. A result letter of this screening mammogram will be mailed directly to the patient. RECOMMENDATION: Screening mammogram in one year. (Code:SM-B-01Y) BI-RADS CATEGORY  1: Negative. Electronically Signed   By: Lajean Manes M.D.   On: 03/10/2018 10:11      Pelvic/Bimanual Pap is not indicated today    Smoking History: Patient has is a current smoker at 1/2 packs per day and refused referral to quit line. She is interested in CT screening for lung cancer. We will refer to [facility].   Patient Navigation: Patient education provided. Access to services provided for patient through [facility] program. No interpreter provided. No transportation provided   Colorectal Cancer Screening: Per patient has had colonoscopy completed on 09/01/2018 and will repeat in 2030.  No complaints today.    Breast and Cervical Cancer Risk Assessment: Patient does not have family history of breast cancer, known genetic mutations, or radiation treatment to the chest before age 69. Patient does not have history of cervical dysplasia, immunocompromised, or DES exposure in-utero.  Risk Scores as of 02/06/2022     [place] Janus           5-year 2.93 %   Lifetime 12.78 %            Last calculated by Claretha Cooper, CMA  on 02/06/2022 at  9:21 AM        A: [facility] exam without pap smear No complaints with benign exam.    P: Referred patient to the [facility] for a screening mammogram. Appointment scheduled 02/06/2022.  Melodye Ped, NP 02/06/2022 9:33 AM

## 2022-02-06 NOTE — Patient Instructions (Signed)
Taught Nicole Barrera about breast self awareness. Patient did not need a Pap smear today due to last Pap smear was in 2019 per patient. Told patient about free cervical cancer screenings to receive a Pap smear if would like one next year. Let her know [facility] will cover Pap smears every 5 years unless has a history of abnormal Pap smears. Referred patient to the [facility] for screening mammogram. Appointment scheduled for 02/06/2022. Patient aware of appointment and will be there. Let patient know will follow up with her within the next couple weeks with results. [name] verbalized understanding. She will return to clinic in one year for mammogram and Pap smear.   Melodye Ped, NP 9:36 AM

## 2022-03-05 ENCOUNTER — Other Ambulatory Visit: Payer: Self-pay

## 2022-03-05 ENCOUNTER — Ambulatory Visit (INDEPENDENT_AMBULATORY_CARE_PROVIDER_SITE_OTHER): Payer: Medicaid Other | Admitting: Student

## 2022-03-05 ENCOUNTER — Encounter: Payer: Self-pay | Admitting: Student

## 2022-03-05 VITALS — BP 146/91 | HR 68 | Wt 131.8 lb

## 2022-03-05 DIAGNOSIS — I10 Essential (primary) hypertension: Secondary | ICD-10-CM

## 2022-03-05 DIAGNOSIS — D492 Neoplasm of unspecified behavior of bone, soft tissue, and skin: Secondary | ICD-10-CM | POA: Insufficient documentation

## 2022-03-05 DIAGNOSIS — E611 Iron deficiency: Secondary | ICD-10-CM | POA: Diagnosis not present

## 2022-03-05 DIAGNOSIS — Z0000 Encounter for general adult medical examination without abnormal findings: Secondary | ICD-10-CM

## 2022-03-05 DIAGNOSIS — R5383 Other fatigue: Secondary | ICD-10-CM

## 2022-03-05 DIAGNOSIS — E876 Hypokalemia: Secondary | ICD-10-CM

## 2022-03-05 MED ORDER — AMLODIPINE BESYLATE 10 MG PO TABS: 10.0000 mg | ORAL_TABLET | Freq: Every day | ORAL | 3 refills | Status: DC

## 2022-03-05 MED ORDER — HYDROCHLOROTHIAZIDE 25 MG PO TABS: 25.0000 mg | ORAL_TABLET | Freq: Every day | ORAL | 3 refills | Status: DC

## 2022-03-05 NOTE — Progress Notes (Signed)
    SUBJECTIVE:   CHIEF COMPLAINT / HPI: Hypertension  Hx of IDA Patient here as she ran out of her blood pressure medications.  Has been without them for about a week.  Currently is taking amlodipine 10 mg and hydrochlorothiazide 25 mg daily.  Also takes a potassium supplement after she was found to be hypokalemic at the ED following a syncopal event a few years ago.  Had another syncopal event that was attributed to iron deficiency anemia.  She is a bit worried that she may be anemic again now as she feels cold and tired.  Lesion of L Eyelid She is also requesting a referral to ophthalmology for potential removal of a mole on the inside of her eyelid.  She reports that this has been present for years but is bothering her more and more.  Occasionally she will get blurred vision and pain related to it.  Has never been told that she has corneal abrasions and denies any history of intense pain and photophobia to suggest corneal abrasions.  Healthcare Maintenance She is overdue for a Tdap booster.  Also due for COVID and flu.  Declines all vaccines. Continues to smoke 1/2 ppd--considering quitting but not ready to commit today.    OBJECTIVE:   BP (!) 146/91   Pulse 68   Wt 131 lb 12.8 oz (59.8 kg)   SpO2 100%   BMI 22.62 kg/m   Gen: Age-appropriate and in good spirits Eyes: Hyperpigmented raised papule to inside of L upper eyelid. Fluorescein exam performed without evidence of abrasion or ulcer.  HENT: MMM Neck: Supple, without LAD Cardio: RRR, no m/r/g Pulm: Normal WOB on RA, lungs clear throughout       ASSESSMENT/PLAN:   Primary hypertension BP elevated today--has been off meds for quite some time. Will re-start today. Question whether HCTZ is the best drug for her given her hx of hypoK. Will monitor--would love to see if she is able to maintain good BP on amlodipine monotherapy after our next visit if BP is well-controlled once restarting drugs today.  - Restart Amlodipine  and HCTZ - Chemistry today - Encourage smoking cessation  Iron deficiency History of. Feeling weak and cold today. - CBC, Ferritin, TSH   Growth of eyelid See photos. Likely needs oculoplastics for removal. - Ambulatory referral to [place] maintenance Encourage vaccination for TDaP, Shingles, COVID, flu. Patient declines all. Encourage smoking cessation--pt to reach out if she would like to try a concerted effort at quitting.      Pearla Dubonnet, MD [place]

## 2022-03-05 NOTE — Assessment & Plan Note (Signed)
History of. Feeling weak and cold today. - CBC, Ferritin, TSH

## 2022-03-05 NOTE — Assessment & Plan Note (Addendum)
See photos. Likely needs oculoplastics for removal. - Ambulatory referral to oculoplastics

## 2022-03-05 NOTE — Assessment & Plan Note (Signed)
BP elevated today--has been off meds for quite some time. Will re-start today. Question whether HCTZ is the best drug for her given her hx of hypoK. Will monitor--would love to see if she is able to maintain good BP on amlodipine monotherapy after our next visit if BP is well-controlled once restarting drugs today.  - Restart Amlodipine and HCTZ - Chemistry today - Encourage smoking cessation

## 2022-03-05 NOTE — Assessment & Plan Note (Signed)
Encourage vaccination for TDaP, Shingles, COVID, flu. Patient declines all. Encourage smoking cessation--pt to reach out if she would like to try a concerted effort at quitting.

## 2022-03-05 NOTE — Patient Instructions (Addendum)
Nicole Barrera,  It's a pleasure to meet you! We need to get you back on your BP meds. I am reordering both.  I am also checking labs--I will call you if we need to make any changes.  When you decide to quit smoking, let me know and we can work together on that!  I am sending you to an eye specialist to remove that mole.  Pearla Dubonnet, MD

## 2022-03-06 LAB — TSH RFX ON ABNORMAL TO FREE T4: TSH: 4.68 u[IU]/mL — ABNORMAL HIGH (ref 0.450–4.500)

## 2022-03-06 LAB — CBC
Hematocrit: 27.8 % — ABNORMAL LOW (ref 34.0–46.6)
Hemoglobin: 9.3 g/dL — ABNORMAL LOW (ref 11.1–15.9)
MCH: 28.6 pg (ref 26.6–33.0)
MCHC: 33.5 g/dL (ref 31.5–35.7)
MCV: 86 fL (ref 79–97)
Platelets: 341 10*3/uL (ref 150–450)
RBC: 3.25 x10E6/uL — ABNORMAL LOW (ref 3.77–5.28)
RDW: 14.9 % (ref 11.7–15.4)
WBC: 3.4 10*3/uL (ref 3.4–10.8)

## 2022-03-06 LAB — COMPREHENSIVE METABOLIC PANEL
ALT: 9 IU/L (ref 0–32)
AST: 16 IU/L (ref 0–40)
Albumin/Globulin Ratio: 0.8 — ABNORMAL LOW (ref 1.2–2.2)
Albumin: 3.6 g/dL — ABNORMAL LOW (ref 3.9–4.9)
Alkaline Phosphatase: 82 IU/L (ref 44–121)
BUN/Creatinine Ratio: 10 — ABNORMAL LOW (ref 12–28)
BUN: 9 mg/dL (ref 8–27)
Bilirubin Total: 0.2 mg/dL (ref 0.0–1.2)
CO2: 25 mmol/L (ref 20–29)
Calcium: 8.9 mg/dL (ref 8.7–10.3)
Chloride: 105 mmol/L (ref 96–106)
Creatinine, Ser: 0.89 mg/dL (ref 0.57–1.00)
Globulin, Total: 4.8 g/dL — ABNORMAL HIGH (ref 1.5–4.5)
Glucose: 76 mg/dL (ref 70–99)
Potassium: 3.5 mmol/L (ref 3.5–5.2)
Sodium: 140 mmol/L (ref 134–144)
Total Protein: 8.4 g/dL (ref 6.0–8.5)
eGFR: 74 mL/min/{1.73_m2} (ref >59)

## 2022-03-06 LAB — FERRITIN: Ferritin: 63 ng/mL (ref 15–150)

## 2022-03-06 LAB — T4F: T4,Free (Direct): 1.43 ng/dL (ref 0.82–1.77)

## 2022-03-12 MED ORDER — FERROUS SULFATE 325 (65 FE) MG PO TBEC: 325.0000 mg | DELAYED_RELEASE_TABLET | Freq: Every day | ORAL | 3 refills | Status: DC

## 2022-03-12 NOTE — Addendum Note (Signed)
Addended by: Jim Like B on: 03/12/2022 10:02 AM   Modules accepted: Orders

## 2022-04-07 ENCOUNTER — Ambulatory Visit: Payer: No Typology Code available for payment source | Admitting: Student

## 2022-04-10 ENCOUNTER — Encounter: Payer: Self-pay | Admitting: Family Medicine

## 2022-04-10 ENCOUNTER — Ambulatory Visit (INDEPENDENT_AMBULATORY_CARE_PROVIDER_SITE_OTHER): Payer: Medicaid Other | Admitting: Family Medicine

## 2022-04-10 ENCOUNTER — Other Ambulatory Visit: Payer: Self-pay

## 2022-04-10 VITALS — BP 144/82 | HR 75 | Wt 129.2 lb

## 2022-04-10 DIAGNOSIS — I10 Essential (primary) hypertension: Secondary | ICD-10-CM | POA: Diagnosis not present

## 2022-04-10 DIAGNOSIS — R42 Dizziness and giddiness: Secondary | ICD-10-CM | POA: Insufficient documentation

## 2022-04-10 NOTE — Patient Instructions (Addendum)
It was wonderful to see you today.  Please bring ALL of your medications with you to every visit.   Today we talked about:  We are doing lab work today to check your blood level and electrolyte levels. I will send you a MyChart message if you have MyChart. Otherwise, I will give you a call for abnormal results or send a letter if everything returned back normal. If you don't hear from me in 2 weeks, please call the office.    I would say to STOP your HCTZ today. Follow up with Dr. Joelyn Oms next week, if your blood pressure is elevated at that time he may consider restarting your HCTZ at a lower dose or another blood pressure medication.  Try to hydrate!!! Get up slowly. Please return if you have any recurrent symptoms.   Thank you for coming to your visit as scheduled. We have had a large "no-show" problem lately, and this significantly limits our ability to see and care for patients. As a friendly reminder- if you cannot make your appointment please call to cancel. We do have a no show policy for those who do not cancel within 24 hours. Our policy is that if you miss or fail to cancel an appointment within 24 hours, 3 times in a 47-monthperiod, you may be dismissed from our clinic.   Thank you for choosing [facility]   Please call [phone] any questions about today's appointment.  Please be sure to schedule follow up at the front  desk before you leave today.   ASharion Settler DO PGY-3 Family Medicine

## 2022-04-10 NOTE — Assessment & Plan Note (Signed)
Elevated initially but her sitting and standing BP are at goal. Given her orthostatics, may be best to take standing BP measurements in the future and not risk adverse effects or over-treatment.  -Continue Amlodipine 10 mg  -Stop HCTZ  -F/u with PCP as scheduled

## 2022-04-10 NOTE — Progress Notes (Signed)
    SUBJECTIVE:   CHIEF COMPLAINT / HPI:   Nicole Barrera is a 62 y.o. female who presents to the [facility] clinic today to discuss the following concerns:   Dizzy States that she got up last night to go to the bathroom and felt really dizzy. She asked her daughter to help her to get to the bathroom to have a bowel movement. Her daughter checked her BP and it was 170/80. She then felt nauseous and vomited and daughter called EMS. States that they gave her a bag of fluid. EKG and pulse was "fine". They felt like she was really dehydrated. She states that she was no longer dizzy but she did feel shaky and dizzy from the fluid. Patient has a history of low potassium and she is wondering if her potassium is low.   She feels "great" today. She also mentions she "rarely" drinks water. She has been trying to work on it- has had 2 bottles of water today.   Hypertension She was last seen on 12/6, had been out of her antihypertensive medications at the time.  She had previously been prescribed amlodipine 10 mg and hydrochlorothiazide 25 mg.  At last visit her medications were refilled.  She has been adherent to her antihypertensives.  She does have a history of hypokalemia, PCP may want to trial amlodipine monotherapy if BP can tolerate.   Labs obtained at last visit significant for hemoglobin 9.3 (baseline seems to be around 9-10). TSH slightly elevated at 4.680. Ferritin 63- she is on iron supplementation.   PERTINENT  PMH / PSH: HTN, normocytic anemia, tobacco use disorder   OBJECTIVE:   BP (!) 144/82   Pulse 75   Wt 129 lb 3.2 oz (58.6 kg)   SpO2 100%   BMI 22.18 kg/m    Orthostatic VS for the past 24 hrs:  BP- Lying Pulse- Lying BP- Sitting Pulse- Sitting BP- Standing at 0 minutes Pulse- Standing at 0 minutes  04/10/22 1343 140/79 71 120/78 72 128/75 96   General: NAD, pleasant, able to participate in exam Cardiac: RRR, no murmurs. Respiratory: CTAB, normal effort, No wheezes, rales or  rhonchi Extremities: no edema Skin: warm and dry Neuro: alert, awake, speech is clear, able to follow commands, normal strength, normal gait, no focal deficits  Psych: Normal affect and mood  ASSESSMENT/PLAN:   Orthostatic dizziness Positive orthostatics in office today.  Likely multifactorial in the setting of her chronic normocytic anemia, dehydration from GI losses and possible electrolyte imbalances. -Check BMP, CBC -Encourage fluid hydration -Stop HCTZ, has PCP appointment next week. Could consider restarting lower dose or switching to another agent (though noted angioedema with ACE-I)   Primary hypertension Elevated initially but her sitting and standing BP are at goal. Given her orthostatics, may be best to take standing BP measurements in the future and not risk adverse effects or over-treatment.  -Continue Amlodipine 10 mg  -Stop HCTZ  -F/u with PCP as scheduled   Sharion Settler, [place]

## 2022-04-10 NOTE — Assessment & Plan Note (Signed)
Positive orthostatics in office today.  Likely multifactorial in the setting of her chronic normocytic anemia, dehydration from GI losses and possible electrolyte imbalances. -Check BMP, CBC -Encourage fluid hydration -Stop HCTZ, has PCP appointment next week. Could consider restarting lower dose or switching to another agent (though noted angioedema with ACE-I)

## 2022-04-11 ENCOUNTER — Other Ambulatory Visit: Payer: Self-pay | Admitting: Family Medicine

## 2022-04-11 LAB — CBC
Hematocrit: 30.1 % — ABNORMAL LOW (ref 34.0–46.6)
Hemoglobin: 9.8 g/dL — ABNORMAL LOW (ref 11.1–15.9)
MCH: 28.2 pg (ref 26.6–33.0)
MCHC: 32.6 g/dL (ref 31.5–35.7)
MCV: 87 fL (ref 79–97)
Platelets: 363 10*3/uL (ref 150–450)
RBC: 3.48 x10E6/uL — ABNORMAL LOW (ref 3.77–5.28)
RDW: 15.5 % — ABNORMAL HIGH (ref 11.7–15.4)
WBC: 3.8 10*3/uL (ref 3.4–10.8)

## 2022-04-11 LAB — BASIC METABOLIC PANEL
BUN/Creatinine Ratio: 13 (ref 12–28)
BUN: 10 mg/dL (ref 8–27)
CO2: 25 mmol/L (ref 20–29)
Calcium: 9.3 mg/dL (ref 8.7–10.3)
Chloride: 101 mmol/L (ref 96–106)
Creatinine, Ser: 0.78 mg/dL (ref 0.57–1.00)
Glucose: 87 mg/dL (ref 70–99)
Potassium: 3.3 mmol/L — ABNORMAL LOW (ref 3.5–5.2)
Sodium: 136 mmol/L (ref 134–144)
eGFR: 86 mL/min/{1.73_m2} (ref >59)

## 2022-04-11 MED ORDER — POTASSIUM CHLORIDE CRYS ER 20 MEQ PO TBCR: 20.0000 meq | EXTENDED_RELEASE_TABLET | Freq: Two times a day (BID) | ORAL | 0 refills | Status: DC

## 2022-04-16 ENCOUNTER — Other Ambulatory Visit: Payer: Self-pay

## 2022-04-16 ENCOUNTER — Ambulatory Visit (INDEPENDENT_AMBULATORY_CARE_PROVIDER_SITE_OTHER): Payer: Medicaid Other | Admitting: Student

## 2022-04-16 VITALS — BP 133/79 | HR 65 | Wt 132.0 lb

## 2022-04-16 DIAGNOSIS — D649 Anemia, unspecified: Secondary | ICD-10-CM | POA: Diagnosis not present

## 2022-04-16 DIAGNOSIS — I10 Essential (primary) hypertension: Secondary | ICD-10-CM | POA: Diagnosis not present

## 2022-04-16 DIAGNOSIS — E876 Hypokalemia: Secondary | ICD-10-CM

## 2022-04-16 DIAGNOSIS — D492 Neoplasm of unspecified behavior of bone, soft tissue, and skin: Secondary | ICD-10-CM | POA: Diagnosis not present

## 2022-04-16 NOTE — Progress Notes (Signed)
    SUBJECTIVE:   CHIEF COMPLAINT / HPI:   Eyelid Growth on L Eye not opening in the morning and is painful, but feels okay once she gets going during the day. Pain is mostly when it is closed. Fluorescein stain last visit without evidence of corneal abrasion.  Oculoplastics appt on the 26th.   Anemia  Dizziness Dizziness is getting better with iron supplementation--no symptoms in several weeks-- but Hgb still lower than I would expect at her last visit with Dr. Nita Sells. Reports that her father had a history of anemia requiring regular iron infusion. No vaginal/rectal bleeding. Recent colonoscopy. Never had an endoscopy.   Hypokalemia Found to have a K of 3.3 at last visit. Does have a history of fairly marked hypo-K (2.9) requiring supplementation in the past. Had three days of K supplementation after her last visit.    OBJECTIVE:   BP 133/79   Pulse 65   Wt 132 lb (59.9 kg)   SpO2 100%   BMI 22.66 kg/m   Physical Exam Constitutional:      General: She is not in acute distress. Eyes:     Comments: Growth of inner eyelid on Left. Stable from my previous exam, conjunctivae are clear.   Cardiovascular:     Rate and Rhythm: Normal rate and regular rhythm.     Heart sounds: Murmur: 2/6 systolic.     No friction rub.  Pulmonary:     Effort: Pulmonary effort is normal. No respiratory distress.     Breath sounds: No wheezing or rales.  Abdominal:     General: Abdomen is flat. There is no distension.     Palpations: Abdomen is soft.     Tenderness: There is no abdominal tenderness.  Neurological:     Mental Status: She is alert.      ASSESSMENT/PLAN:   Growth of eyelid Stable from my previous exam. Will need excision with oculoplastics. Has an appt in one week. - Management per oculoplastics  Normocytic anemia Difficult to parse what is going on here.  Her history of response to iron supplementation points towards an iron deficiency anemia, however her ferritin is  normal at 63 (albeit on the lower end of normal).  Her response to iron supplementation in terms of managing her symptoms is obviously reassuring.  No overt blood loss.  Warrants a further workup. -Will obtain a full anemia profile to better characterize anemia  -Referral to GI for endoscopy -Will check a renin-aldosterone activity ratio to rule out hyperaldosteronism as a driving factor behind her HTN, anemia, and hypo-K  Hypokalemia S/p 3 days of supplementation - Repeat BMP - Evaluation for hyperaldosteronism as above     Pearla Dubonnet, MD [place]

## 2022-04-16 NOTE — Patient Instructions (Signed)
Ms. Kontos,  Excellent to see you. I'm so glad your dizziness is getting better. Let's do some more looking into your anemia though to make sure we are covering our bases. I will check some labs today and then also have our GI docs reach out to you about an upper endoscopy.  Be sure you make that appt with occuloplastics next week. Ultimately the treatment for your eye will be removing the growth.   Keep taking the iron every day. I will call you with your results.   Come back to see me in 3 months or so.   Pearla Dubonnet, MD

## 2022-04-17 NOTE — Assessment & Plan Note (Signed)
S/p 3 days of supplementation - Repeat BMP - Evaluation for hyperaldosteronism as above

## 2022-04-17 NOTE — Assessment & Plan Note (Signed)
Difficult to parse what is going on here.  Her history of response to iron supplementation points towards an iron deficiency anemia, however her ferritin is normal at 63 (albeit on the lower end of normal).  Her response to iron supplementation in terms of managing her symptoms is obviously reassuring.  No overt blood loss.  Warrants a further workup. -Will obtain a full anemia profile to better characterize anemia  -Referral to GI for endoscopy -Will check a renin-aldosterone activity ratio to rule out hyperaldosteronism as a driving factor behind her HTN, anemia, and hypo-K

## 2022-04-17 NOTE — Assessment & Plan Note (Signed)
Stable from my previous exam. Will need excision with oculoplastics. Has an appt in one week. - Management per oculoplastics

## 2022-04-21 ENCOUNTER — Other Ambulatory Visit: Payer: Self-pay | Admitting: *Deleted

## 2022-04-21 DIAGNOSIS — Z122 Encounter for screening for malignant neoplasm of respiratory organs: Secondary | ICD-10-CM

## 2022-04-21 DIAGNOSIS — F17210 Nicotine dependence, cigarettes, uncomplicated: Secondary | ICD-10-CM

## 2022-04-21 DIAGNOSIS — Z87891 Personal history of nicotine dependence: Secondary | ICD-10-CM

## 2022-04-22 LAB — BASIC METABOLIC PANEL
BUN/Creatinine Ratio: 11 — ABNORMAL LOW (ref 12–28)
BUN: 8 mg/dL (ref 8–27)
CO2: 22 mmol/L (ref 20–29)
Calcium: 9 mg/dL (ref 8.7–10.3)
Chloride: 105 mmol/L (ref 96–106)
Creatinine, Ser: 0.76 mg/dL (ref 0.57–1.00)
Glucose: 88 mg/dL (ref 70–99)
Potassium: 4.5 mmol/L (ref 3.5–5.2)
Sodium: 140 mmol/L (ref 134–144)
eGFR: 89 mL/min/{1.73_m2} (ref >59)

## 2022-04-22 LAB — ANEMIA PROFILE B
Basophils Absolute: 0 10*3/uL (ref 0.0–0.2)
Basos: 1 %
EOS (ABSOLUTE): 0.1 10*3/uL (ref 0.0–0.4)
Eos: 2 %
Ferritin: 64 ng/mL (ref 15–150)
Folate: 10.5 ng/mL (ref >3.0)
Hematocrit: 30.1 % — ABNORMAL LOW (ref 34.0–46.6)
Hemoglobin: 9.8 g/dL — ABNORMAL LOW (ref 11.1–15.9)
Immature Grans (Abs): 0 10*3/uL (ref 0.0–0.1)
Immature Granulocytes: 0 %
Iron Saturation: 27 % (ref 15–55)
Iron: 63 ug/dL (ref 27–139)
Lymphocytes Absolute: 0.9 10*3/uL (ref 0.7–3.1)
Lymphs: 27 %
MCH: 28.5 pg (ref 26.6–33.0)
MCHC: 32.6 g/dL (ref 31.5–35.7)
MCV: 88 fL (ref 79–97)
Monocytes Absolute: 0.2 10*3/uL (ref 0.1–0.9)
Monocytes: 6 %
Neutrophils Absolute: 2.2 10*3/uL (ref 1.4–7.0)
Neutrophils: 64 %
Platelets: 342 10*3/uL (ref 150–450)
RBC: 3.44 x10E6/uL — ABNORMAL LOW (ref 3.77–5.28)
RDW: 16.1 % — ABNORMAL HIGH (ref 11.7–15.4)
Retic Ct Pct: 1.3 % (ref 0.6–2.6)
Total Iron Binding Capacity: 231 ug/dL — ABNORMAL LOW (ref 250–450)
UIBC: 168 ug/dL (ref 118–369)
Vitamin B-12: 220 pg/mL — ABNORMAL LOW (ref 232–1245)
WBC: 3.4 10*3/uL (ref 3.4–10.8)

## 2022-04-22 LAB — ALDOSTERONE + RENIN ACTIVITY W/ RATIO
Aldos/Renin Ratio: >50.3 — ABNORMAL HIGH (ref 0.0–30.0)
Aldosterone: 8.4 ng/dL (ref 0.0–30.0)
Renin Activity, Plasma: <0.167 ng/mL/hr — ABNORMAL LOW (ref 0.167–5.380)

## 2022-04-25 ENCOUNTER — Encounter: Payer: Self-pay | Admitting: Plastic Surgery

## 2022-04-25 ENCOUNTER — Ambulatory Visit (INDEPENDENT_AMBULATORY_CARE_PROVIDER_SITE_OTHER): Payer: Medicaid Other | Admitting: Plastic Surgery

## 2022-04-25 VITALS — BP 137/77 | HR 95 | Ht 64.0 in | Wt 129.4 lb

## 2022-04-25 DIAGNOSIS — D492 Neoplasm of unspecified behavior of bone, soft tissue, and skin: Secondary | ICD-10-CM

## 2022-04-25 DIAGNOSIS — D485 Neoplasm of uncertain behavior of skin: Secondary | ICD-10-CM

## 2022-04-25 NOTE — Progress Notes (Signed)
Patient ID: Nicole Barrera, female    DOB: 10-11-1960, 62 y.o.   MRN: [id number]   Chief Complaint  Patient presents with   Consult    The patient is a 62 year old female here for evaluation of a changing skin lesion on her left upper eyelid.  When the patient looks up you can see the lesion.  It is hyperpigmented and resting on the opening of the tarsal glands.  It is proximal to the eyelashes.  It looks like it abuts the meibomian gland orifices.  It is approximately 3 mm in size.  It seems to be slightly irregular.  The patient says it is starting to be aggravating and may even be causing her pain with scratching her cornea.    Review of Systems  Constitutional: Negative.   Eyes: Negative.   Respiratory: Negative.    Cardiovascular: Negative.   Gastrointestinal: Negative.   Endocrine: Negative.   Genitourinary: Negative.   Musculoskeletal: Negative.   Hematological: Negative.     Past Medical History:  Diagnosis Date   Allergy    ANEMIA-IRON DEFICIENCY 02/27/2009   Qualifier: Diagnosis of  By: Jorene Minors, [place]     HTN (hypertension)    PEPTIC ULCER DISEASE 11/19/2006   Qualifier: Diagnosis of  By: Radene Ou MD, [place]     Tobacco abuse     No past surgical history on file.    Current Outpatient Medications:    amLODipine (NORVASC) 10 MG tablet, Take 1 tablet (10 mg total) by mouth daily., Disp: 90 tablet, Rfl: 3   ferrous sulfate 325 (65 FE) MG EC tablet, Take 1 tablet (325 mg total) by mouth daily with breakfast., Disp: 90 tablet, Rfl: 3   potassium chloride SA (KLOR-CON M) 20 MEQ tablet, Take 1 tablet (20 mEq total) by mouth 2 (two) times daily for 3 days., Disp: 6 tablet, Rfl: 0   Objective:   Vitals:   04/25/22 0854  BP: 137/77  Pulse: 95  SpO2: 100%    Physical Exam Vitals reviewed.  Constitutional:      Appearance: Normal appearance.  Eyes:   Cardiovascular:     Rate and Rhythm: Normal rate.     Pulses: Normal pulses.  Skin:    General:  Skin is warm.     Capillary Refill: Capillary refill takes less than 2 seconds.     Coloration: Skin is not jaundiced.     Findings: Lesion present.  Neurological:     Mental Status: She is alert and oriented to person, place, and time.  Psychiatric:        Behavior: Behavior normal.        Thought Content: Thought content normal.        Judgment: Judgment normal.     Assessment & Plan:  Growth of eyelid  Due to the location I am going to refer the patient to Dr. Lorina Rabon for further evaluation.  Pictures were obtained of the patient and placed in the chart with the patient's or guardian's permission.   [place], DO

## 2022-05-01 ENCOUNTER — Telehealth: Payer: Self-pay | Admitting: Student

## 2022-05-01 NOTE — Telephone Encounter (Signed)
Patient called stating that the plastic surgeon she was referred to does not accept medicaid. She did find an ophthalmologist that does take medicaid, and would like to be referred to them if possible. Dr. Jola Schmidt with [facility], phone [phone]

## 2022-05-02 NOTE — Telephone Encounter (Signed)
Referral faxed.  Laiza Veenstra,CMA  

## 2022-05-16 ENCOUNTER — Ambulatory Visit: Payer: Self-pay

## 2022-05-16 ENCOUNTER — Telehealth: Payer: Self-pay | Admitting: *Deleted

## 2022-05-16 DIAGNOSIS — D492 Neoplasm of unspecified behavior of bone, soft tissue, and skin: Secondary | ICD-10-CM

## 2022-05-16 DIAGNOSIS — I10 Essential (primary) hypertension: Secondary | ICD-10-CM

## 2022-05-16 NOTE — Patient Instructions (Signed)
Visit Information  Thank you for taking time to visit with me today. Please don't hesitate to contact me if I can be of assistance to you.   Following are the goals we discussed today:  Work with Managed Medicaid SW to determine available resources   If you are experiencing a Mental Health or [place] or need someone to talk to, please call 911  Patient verbalizes understanding of instructions and care plan provided today and agrees to view in [place]. Active MyChart status and patient understanding of how to access instructions and care plan via MyChart confirmed with patient.     Daneen Schick, BSW, CDP Social Worker, Certified Dementia Practitioner [place] [facility]  Care Coordination [phone]

## 2022-05-16 NOTE — Telephone Encounter (Signed)
Patient states that she is needing a referral to see an ophthalmologist.  She saw plastics about the mole under her eye but they wanted her to be seen with ophtho.  Will forward to MD to place a new referral.  I will send it to dr. Kellie Moor office next week. Ajani Schnieders,CMA

## 2022-05-16 NOTE — Patient Outreach (Signed)
  Care Coordination   Initial Visit Note   05/16/2022 Name: Nicole Barrera MRN: [id number] DOB: 07/27/1960  Nicole Barrera is a 62 y.o. year old female who sees Eppie Gibson, MD for primary care. I spoke with  Nicole Barrera by phone today. Patient reports difficulty with financial strain related to utilities, property taxes, and home repair needs. Performed chart review to note patient is covered under a Managed Medicaid plan. Discussed plan for SW to refer patient to the Managed Medicaid team for assistance with resource navigation.  What matters to the patients health and wellness today?  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, fashion/clothing   SDOH assessments and interventions completed:  Yes  SDOH Interventions Today    Flowsheet Row Most Recent Value  SDOH Interventions   Housing Interventions AMB Referral  [Managed Medicaid SW]  Utilities Interventions AMB Referral  [Managed Medicaid]  Financial Strain Interventions [id number] Referral  [Managed Medicaid]        Care Coordination Interventions:  Yes, provided   Interventions Today    Flowsheet Row Most Recent Value  Chronic Disease   Chronic disease during today's visit Hypertension (HTN)  Mental Health Interventions   Mental Health Discussed/Reviewed Refer to Social Work for resources  [employer]  Refer to Social Work for resources regarding Other  [Financial Strain]       Follow up plan: Referral made to Managed Medicaid SW.    Encounter Outcome:  Pt. Visit Completed   Daneen Schick, [place], CDP Social Worker, Certified Dementia Practitioner [facility] Management  Care Coordination [phone]

## 2022-05-28 ENCOUNTER — Other Ambulatory Visit: Payer: Medicaid Other

## 2022-05-28 NOTE — Patient Instructions (Addendum)
  Medicaid Managed Care   Unsuccessful Outreach Note  05/28/2022 Name: MALASHIA MERCIL MRN: [id number] DOB: 1960/11/02  Referred by: Eppie Gibson, MD Reason for referral : High Risk Managed Medicaid (MM social work telephone outreach )   An unsuccessful telephone outreach was attempted today. The patient was referred to the case management team for assistance with care management and care coordination.   Follow Up Plan: A HIPAA compliant phone message was left for the patient providing contact information and requesting a return call.   Mickel Fuchs, BSW, [place] Managed Medicaid Team  [phone] Visit Information  Ms. Salazar was given information about Medicaid Managed Care team care coordination services as a part of their [place] Medicaid benefit. Jory Sims Lebleu verbally consentedto engagement with the [facility] Managed Care team.   If you are experiencing a medical emergency, please call 911 or report to your local emergency department or urgent care.   If you have a non-emergency medical problem during routine business hours, please contact your provider's office and ask to speak with a nurse.   For questions related to your [facility] health plan, please call: [phone]  OR visit the member homepage at: [email or web address]  If you would like to schedule transportation through your [facility] plan, please call the following number at least 2 days in advance of your appointment: [phone]  If you are experiencing a behavioral health crisis, call the [place] at [phone]). The line is available 24 hours a day, seven days a week.  If you would like help to quit smoking, call 1-800-QUIT-NOW 548-652-5906) OR Espaol: 1-855-Djelo-Ya HD:1601594) o para  ms informacin haga clic aqu or Text READY to 200-400 to register via text  Ms. Schreck - following are the goals we discussed in your visit today:   Goals Addressed   None      Social Worker will follow up on 06/26/22.   Mickel Fuchs, BSW, [place] Managed Medicaid Team  [phone]   Following is a copy of your plan of care:  There are no care plans that you recently modified to display for this patient.

## 2022-05-28 NOTE — Patient Outreach (Signed)
  Medicaid Managed Care   Unsuccessful Outreach Note  05/28/2022 Name: Nicole Barrera MRN: [id number] DOB: June 21, 1960  Referred by: Eppie Gibson, MD Reason for referral : High Risk Managed Medicaid (MM social work telephone outreach )   An unsuccessful telephone outreach was attempted today. The patient was referred to the case management team for assistance with care management and care coordination.   Follow Up Plan: A HIPAA compliant phone message was left for the patient providing contact information and requesting a return call.   Mickel Fuchs, BSW, [place] Managed Medicaid Team  [phone]

## 2022-05-28 NOTE — Patient Outreach (Signed)
Medicaid Managed Care Social Work Note  05/28/2022 Name:  Nicole Barrera MRN:  [id number] DOB:  July 05, 1960  Nicole Barrera is an 62 y.o. year old female who is a primary patient of Eppie Gibson, MD.  The [place] team was consulted for assistance with:  Community Resources   [name] was given information about Medicaid Managed Care Coordination team services today. Nicole Barrera Patient agreed to services and verbal consent obtained.  Engaged with patient  for by telephone forinitial visit in response to referral for case management and/or care coordination services.   Assessments/Interventions:  Review of past medical history, allergies, medications, health status, including review of consultants reports, laboratory and other test data, was performed as part of comprehensive evaluation and provision of chronic care management services.  SDOH: (Social Determinant of Health) assessments and interventions performed: SDOH Interventions    [place] Coordination from 05/16/2022 in [place] Interventions   Housing Interventions AMB Referral  [Managed Medicaid SW]  Utilities Interventions AMB Referral  [Managed Medicaid]  Financial Strain Interventions [id number] Referral  [Managed Medicaid]     BSW completed a telephone outreach with patient. She stated she is not working. She is in the household with her husband that she takes care of. They are living off of his 1800 disability check and receive foodstamps of 49 each month. Patient states she applied for the [employer] program a few months ago but the payment still has not been applied, she was able to borrow 300 that she needed to keep it from getting cut off. Patient states they own the home and the roof is leaking, BSW will send resources to home repairs and utilities to [email or web address]  Advanced Directives Status:  Not addressed in this  encounter.  Care Plan                 Allergies  Allergen Reactions   Lisinopril     angioedema    Medications Reviewed Today     Reviewed by Harl Bowie, CMA (Certified Medical Assistant) on 04/25/22 at [phone]  Med List Status: <None>   Medication Order Taking? Sig Documenting Provider Last Dose Status Informant  amLODipine (NORVASC) 10 MG tablet [id number] Yes Take 1 tablet (10 mg total) by mouth daily. Eppie Gibson, MD Taking Active   ferrous sulfate 325 (65 FE) MG EC tablet [id number] Yes Take 1 tablet (325 mg total) by mouth daily with breakfast. Eppie Gibson, MD Taking Active   potassium chloride SA (KLOR-CON M) 20 MEQ tablet [id number]  Take 1 tablet (20 mEq total) by mouth 2 (two) times daily for 3 days. 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 08/16/2021   Iron deficiency 07/22/2021   Acute pain of left knee 07/22/2021   Blood glucose elevated 07/22/2021   Normocytic anemia 06/02/2018   Positive FIT (fecal immunochemical test) 06/02/2018   Hypokalemia 09/28/2017   Healthcare maintenance 07/28/2016   Venous stasis dermatitis of both lower extremities 02/05/2015   De Quervain's tenosynovitis 07/24/2014   Pain, dental 03/02/2013   Trigger thumb of left hand 11/18/2012   Palpitations 01/17/2009   Tobacco use disorder 07/28/2008  Primary hypertension 0000000   Systolic murmur 0000000    Conditions to be addressed/monitored per PCP order:   community resources  There are no care plans that you recently modified to display for this patient.   Follow up:  Patient agrees to Care Plan and Follow-up.  Plan: The Managed Medicaid care management team will reach out to the patient again over the next 30 days.  Date/time  of next scheduled Social Work care management/care coordination outreach:  06/26/22  Mickel Fuchs, Arita Miss, [place] Medicaid Team  [phone]

## 2022-05-30 NOTE — Telephone Encounter (Signed)
Following up on this message

## 2022-06-02 NOTE — Telephone Encounter (Signed)
Referral faxed.  Emmery Seiler,CMA

## 2022-06-04 ENCOUNTER — Encounter: Payer: Medicaid Other | Admitting: Acute Care

## 2022-06-04 ENCOUNTER — Telehealth: Payer: Self-pay | Admitting: Acute Care

## 2022-06-04 NOTE — Telephone Encounter (Signed)
I have attempted to call the patient for her scheduled 9:30 am lung cancer screening  Shared decision making visit. There was no answer. I have left a HIPPA compliant VM requesting the patient call the office so we can get the visit done. If we do not get in touch with her in the next 5-10 minutes, we will have to cancel the CT Chest scheduled for 3/7 and reschedule both SDMV and scan. We will continue to try to reach the patient until 9:45 am which is the latest time I can start and complete the visit prior to the next scheduled patient visit.

## 2022-06-04 NOTE — Telephone Encounter (Signed)
Visit marked as a no show. CT has been cancelled. Will reach out to pt to reschedule

## 2022-06-05 ENCOUNTER — Inpatient Hospital Stay: Admission: RE | Admit: 2022-06-05 | Payer: Medicaid Other | Source: Ambulatory Visit

## 2022-06-26 ENCOUNTER — Other Ambulatory Visit: Payer: Medicaid Other

## 2022-06-26 NOTE — Patient Instructions (Signed)
Visit Information  Ms. Geil was given information about Medicaid Managed Care team care coordination services as a part of their [place] Medicaid benefit. Jory Sims Sohm verbally consentedto engagement with the [facility] Managed Care team.   If you are experiencing a medical emergency, please call 911 or report to your local emergency department or urgent care.   If you have a non-emergency medical problem during routine business hours, please contact your provider's office and ask to speak with a nurse.   For questions related to your [facility] health plan, please call: [phone]  OR visit the member homepage at: [email or web address]  If you would like to schedule transportation through your [facility] plan, please call the following number at least 2 days in advance of your appointment: [phone]  If you are experiencing a behavioral health crisis, call the [place] at [phone]). The line is available 24 hours a day, seven days a week.  If you would like help to quit smoking, call 1-800-QUIT-NOW 9721583417) OR Espaol: 1-855-Djelo-Ya QO:409462) o para ms informacin haga clic aqu or Text READY to 200-400 to register via text  Ms. Aldredge - following are the goals we discussed in your visit today:   Goals Addressed   None       The  Patient                                              has been provided with contact information for the Managed Medicaid care management team and has been advised to call with any health related questions or concerns.   Mickel Fuchs, BSW, [place] Managed Medicaid Team  [phone]   Following is a copy of your plan of care:  There are no care plans that you recently modified to display for this patient.

## 2022-06-26 NOTE — Patient Outreach (Signed)
Medicaid Managed Care Social Work Note  06/26/2022 Name:  Nicole Barrera MRN:  [id number] DOB:  09-02-1960  Nicole Barrera is an 62 y.o. year old female who is a primary patient of Eppie Gibson, MD.  The [place] team was consulted for assistance with:  Community Resources   [name] was given information about Medicaid Managed Care Coordination team services today. Nicole Barrera Patient agreed to services and verbal consent obtained.  Engaged with patient  for by telephone forfollow up visit in response to referral for case management and/or care coordination services.   Assessments/Interventions:  Review of past medical history, allergies, medications, health status, including review of consultants reports, laboratory and other test data, was performed as part of comprehensive evaluation and provision of chronic care management services.  SDOH: (Social Determinant of Health) assessments and interventions performed: SDOH Interventions    [place] Coordination from 05/16/2022 in [place] Interventions   Housing Interventions AMB Referral  [Managed Medicaid SW]  Utilities Interventions AMB Referral  [Managed Medicaid]  Financial Strain Interventions [id number] Referral  [Managed Medicaid]      BSW completed  telephone outreach with patient. She stated she can not remember the name of the company she thinks it is the housing coalition may be able to help her pay her property taxes. Patient states no other resources are needed at this time. Advanced Directives Status:  Not addressed in this encounter.   Care Plan                 Allergies  Allergen Reactions   Lisinopril     angioedema    Medications Reviewed Today     Reviewed by Harl Bowie, CMA (Certified Medical Assistant) on 04/25/22 at [phone]  Med List Status: <None>   Medication Order Taking? Sig Documenting Provider Last  Dose Status Informant  amLODipine (NORVASC) 10 MG tablet [id number] Yes Take 1 tablet (10 mg total) by mouth daily. Eppie Gibson, MD Taking Active   ferrous sulfate 325 (65 FE) MG EC tablet [id number] Yes Take 1 tablet (325 mg total) by mouth daily with breakfast. Eppie Gibson, MD Taking Active   potassium chloride SA (KLOR-CON M) 20 MEQ tablet [id number]  Take 1 tablet (20 mEq total) by mouth 2 (two) times daily for 3 days. 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 08/16/2021   Iron deficiency 07/22/2021   Acute pain of left knee 07/22/2021   Blood glucose elevated 07/22/2021   Normocytic anemia 06/02/2018   Positive FIT (fecal immunochemical test) 06/02/2018   Hypokalemia 09/28/2017   Healthcare maintenance 07/28/2016   Venous stasis dermatitis of both lower extremities 02/05/2015   De Quervain's tenosynovitis 07/24/2014   Pain, dental 03/02/2013   Trigger thumb of left hand 11/18/2012   Palpitations 01/17/2009   Tobacco use disorder 07/28/2008   Primary hypertension 0000000   Systolic murmur 0000000    Conditions to be addressed/monitored per PCP order:   community reources  There are no care plans that you recently modified to display for this patient.   Follow up:  Patient agrees to Care Plan and Follow-up.  Plan: The  Patient has been provided with contact information for the Managed Medicaid care management team and has been advised to call with any health related questions or concerns.    Mickel Fuchs, BSW, [place] Managed Medicaid Team  [phone]

## 2022-06-30 ENCOUNTER — Encounter: Payer: Self-pay | Admitting: Student

## 2022-06-30 ENCOUNTER — Ambulatory Visit (INDEPENDENT_AMBULATORY_CARE_PROVIDER_SITE_OTHER): Payer: Medicaid Other | Admitting: Student

## 2022-06-30 ENCOUNTER — Telehealth: Payer: Self-pay | Admitting: Student

## 2022-06-30 VITALS — BP 134/80 | HR 81 | Ht 64.0 in | Wt 135.2 lb

## 2022-06-30 DIAGNOSIS — E269 Hyperaldosteronism, unspecified: Secondary | ICD-10-CM

## 2022-06-30 DIAGNOSIS — Z0000 Encounter for general adult medical examination without abnormal findings: Secondary | ICD-10-CM

## 2022-06-30 DIAGNOSIS — Z23 Encounter for immunization: Secondary | ICD-10-CM | POA: Diagnosis not present

## 2022-06-30 DIAGNOSIS — D649 Anemia, unspecified: Secondary | ICD-10-CM | POA: Diagnosis not present

## 2022-06-30 DIAGNOSIS — D492 Neoplasm of unspecified behavior of bone, soft tissue, and skin: Secondary | ICD-10-CM

## 2022-06-30 MED ORDER — ZOSTER VAC RECOMB ADJUVANTED 50 MCG/0.5ML IM SUSR
0.5000 mL | Freq: Once | INTRAMUSCULAR | 0 refills | Status: AC
Start: 2022-06-30 — End: 2022-06-30

## 2022-06-30 NOTE — Telephone Encounter (Signed)
Patient walked in stating the referral to [facility] was not received by them and the correct fax number is [phone]

## 2022-06-30 NOTE — Progress Notes (Unsigned)
    SUBJECTIVE:   CHIEF COMPLAINT / HPI:   Anemia Unclear cause. No VB, UTD on Colonoscopy, nl study in 2020. Referred to GI for UE but this didn't happen. 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 Initial testing with impressive hyporeninemia but without obvious hyperaldo given Aldosterone concentration 8.4ng/dL. However, Aldos/Renin Ration >50.3.  Also K was up to 4.5 on last draw, clouding the picture.   Growth of Eyelid I tried to refer to oculoplastics but no one local so saw plastics ([facility]) who did not feel comfortable removing. They recommended Dr. Lorina Rabon remove, but he does not accept her insurance. Attempting to get seen by [facility].   PERTINENT  PMH / PSH: ***  OBJECTIVE:   There were no vitals taken for this visit.  ***  ASSESSMENT/PLAN:   No problem-specific Assessment & Plan notes found for this encounter.     Nicole Guarneri, MD [place]

## 2022-06-30 NOTE — Patient Instructions (Addendum)
We need to figure out what is causing your BP to be high and your renin (an enzyme made by your kidneys) to be low. I also want to get a look at your adrenal glands with a CT Scan.  We will call you to schedule this. We will check your potassium again today. We will also recheck your hemoglobin.  At our last visit we referred you to [facility] for an upper endoscopy, please call them to set up an appointment:  [facility] Address: [address], [place], [place] [address] Phone: [phone]   I will make sure the referral for your eye gets to the right place.    As you are >62 years old, we recommend a Shingles vaccine, you can get this done at any community pharmacy.  Marnee Guarneri, MD

## 2022-07-01 ENCOUNTER — Telehealth: Payer: Self-pay

## 2022-07-01 DIAGNOSIS — E269 Hyperaldosteronism, unspecified: Secondary | ICD-10-CM | POA: Insufficient documentation

## 2022-07-01 LAB — BASIC METABOLIC PANEL
BUN/Creatinine Ratio: 11 — ABNORMAL LOW (ref 12–28)
BUN: 10 mg/dL (ref 8–27)
CO2: 25 mmol/L (ref 20–29)
Calcium: 8.6 mg/dL — ABNORMAL LOW (ref 8.7–10.3)
Chloride: 107 mmol/L — ABNORMAL HIGH (ref 96–106)
Creatinine, Ser: 0.88 mg/dL (ref 0.57–1.00)
Glucose: 85 mg/dL (ref 70–99)
Potassium: 4.1 mmol/L (ref 3.5–5.2)
Sodium: 141 mmol/L (ref 134–144)
eGFR: 75 mL/min/{1.73_m2} (ref >59)

## 2022-07-01 LAB — HEMOGLOBIN AND HEMATOCRIT, BLOOD
Hematocrit: 28.3 % — ABNORMAL LOW (ref 34.0–46.6)
Hemoglobin: 9.4 g/dL — ABNORMAL LOW (ref 11.1–15.9)

## 2022-07-01 NOTE — Assessment & Plan Note (Addendum)
-   TDaP today - Will get Shingrix at pharmacy

## 2022-07-01 NOTE — Assessment & Plan Note (Addendum)
Difficulty in getting her to the right specialist. Needs oculoplastics. Will send direct referral to Dr. Hollice Espy at [facility]

## 2022-07-01 NOTE — Assessment & Plan Note (Signed)
Aldosterone Renin Activity Ration >50.3 is fairly convincing for hyperaldosteronism. Will obtain CT Adrenals to differentiate between aldosterone-producing adenoma vs bilateral adrenal hyperplasia secondary to idiopathic hyperaldosteronism. If CT does not show evidence of an adenoma, would initiate spironolactone. - BMP today to monitor K, hx of hypoK to 2.9 - CT Adrenal - BP controlled on amlodipine for now, as above, would start spironolactone if CT is without adenoma

## 2022-07-01 NOTE — Assessment & Plan Note (Signed)
As above, unclear cause. Suspect 2/2 hyperaldosteronism vs upper GI source. Has been supplementing iron, though iron studies did not suggest IDA as cause. - Will recheck an H&H today - Hyperaldosteronism workup as below - She will call GI to get her upper endoscopy scheduled

## 2022-07-01 NOTE — Telephone Encounter (Signed)
Called patient and informed her of referral information.  [employer], [place] Address: [address] [place], [place] [address] Phone: [phone]  .Ozella Almond, CMA

## 2022-07-02 ENCOUNTER — Encounter: Payer: Self-pay | Admitting: Student

## 2022-07-02 NOTE — Telephone Encounter (Signed)
I called patient today to follow up on her referral and where we stand.  I told her I received a call from [facility] in [place] about getting her set up with their oculoplastics resident but they don't have any openings at this time.  Patient voiced understanding and that she is on their recall list for any cancellations or when a new schedule comes out.  I was informed by [facility] that they have been sending their patients to [facility] for oculoplastics as well.  Patient was agreeable with using this as a last option.  She will wait to hear from [place] and see what they can offer her.  Nowell Sites,CMA

## 2022-07-03 ENCOUNTER — Ambulatory Visit (INDEPENDENT_AMBULATORY_CARE_PROVIDER_SITE_OTHER): Payer: Medicaid Other | Admitting: Family Medicine

## 2022-07-03 ENCOUNTER — Encounter: Payer: Self-pay | Admitting: Family Medicine

## 2022-07-03 VITALS — BP 128/82 | HR 72 | Ht 64.0 in | Wt 130.8 lb

## 2022-07-03 DIAGNOSIS — H5712 Ocular pain, left eye: Secondary | ICD-10-CM

## 2022-07-03 NOTE — Progress Notes (Signed)
    SUBJECTIVE:   CHIEF COMPLAINT / HPI:   Patient presents for continued left eye pain. She has been since twice for this- in January and 3 days ago. Has had a growth on inside of L eye for years but in January started to become painful. At that time Fluorescein stain performed without evidence of corneal abrasion. Was referred to oculoplastics but they were not comfortable removing. Referral ultimately was  placed to [facility] and appointment was originally scheduled in a few weeks but while in the visit today she received notice that her appointment was moved up to next week on 4/9.   Today she states since yesterday has felt increased pain, redness and her left eye is extremely sensitive to light. She started wearing a patch over it today. Feels that her vision is not affected. Does have pain when she looks around.    PERTINENT  PMH / PSH: HTN, hyperaldosteronism   OBJECTIVE:   BP 128/82   Pulse 72   Ht 5\' 4"  (1.626 m)   Wt 130 lb 12.8 oz (59.3 kg)   SpO2 98%   BMI 22.45 kg/m    Physical exam General: well appearing, NAD HEENT: erythematous conjunctiva  Cardiovascular: RRR, no murmurs Lungs: CTAB. Normal WOB Abdomen: soft, non-distended, non-tender Skin: warm, dry. No edema    ASSESSMENT/PLAN:   Eye pain, left Patient presents with worsening pain of her left eye over the past day. Given her acute increase in pain, erythema, and photophobia I am concerned for possible Uveitis. Could also potentially be conjunctivitis, Keratitis, corneal abrasion. It is reassuring that her vision is in tact. Called on call Ophthalmologist Dr. Allena Katz who agreed to see her today after our visit. Advised patient to proceed to their office immediately after visit as I am concerned about her eye. She expressed understanding.     Cora Collum, DO [facility]

## 2022-07-03 NOTE — Patient Instructions (Addendum)
It was great seeing you today!  Im sorry you have been experiencing your eye pain. We were able to get you in to an urgent appointment today immediately following this visit. Please see Dr. Posey Pronto  [address], [place], [place] [address] [phone]  Feel free to call with any questions or concerns at any time, at [phone].   Take care, do mind giving her the same yes just where she was Dr. Shary Key [facility]

## 2022-07-05 DIAGNOSIS — H5712 Ocular pain, left eye: Secondary | ICD-10-CM | POA: Insufficient documentation

## 2022-07-05 NOTE — Assessment & Plan Note (Signed)
Patient presents with worsening pain of her left eye over the past day. Given her acute increase in pain, erythema, and photophobia I am concerned for possible Uveitis. Could also potentially be conjunctivitis, Keratitis, corneal abrasion. It is reassuring that her vision is in tact. Called on call Ophthalmologist Dr. Allena Katz who agreed to see her today after our visit. Advised patient to proceed to their office immediately after visit as I am concerned about her eye. She expressed understanding.

## 2022-07-08 DIAGNOSIS — D485 Neoplasm of uncertain behavior of skin: Secondary | ICD-10-CM | POA: Diagnosis not present

## 2022-08-20 ENCOUNTER — Inpatient Hospital Stay: Admission: RE | Admit: 2022-08-20 | Payer: Medicaid Other | Source: Ambulatory Visit

## 2022-09-10 ENCOUNTER — Ambulatory Visit: Payer: Medicaid Other | Admitting: Family Medicine

## 2022-10-14 ENCOUNTER — Ambulatory Visit: Payer: Medicaid Other | Admitting: Student

## 2022-10-28 ENCOUNTER — Ambulatory Visit: Payer: Medicaid Other | Admitting: Student

## 2022-11-05 ENCOUNTER — Ambulatory Visit: Payer: Medicaid Other | Admitting: Student

## 2022-11-05 ENCOUNTER — Other Ambulatory Visit: Payer: Self-pay

## 2022-11-05 ENCOUNTER — Encounter: Payer: Self-pay | Admitting: Student

## 2022-11-05 ENCOUNTER — Ambulatory Visit (HOSPITAL_COMMUNITY)
Admission: RE | Admit: 2022-11-05 | Discharge: 2022-11-05 | Disposition: A | Discharge status: Home / Self Care | Payer: Medicaid Other | Source: Ambulatory Visit | Attending: Family Medicine | Admitting: Family Medicine

## 2022-11-05 VITALS — BP 140/90 | HR 84 | Ht 63.0 in | Wt 130.8 lb

## 2022-11-05 DIAGNOSIS — I10 Essential (primary) hypertension: Secondary | ICD-10-CM

## 2022-11-05 DIAGNOSIS — R55 Syncope and collapse: Secondary | ICD-10-CM | POA: Diagnosis not present

## 2022-11-05 DIAGNOSIS — R112 Nausea with vomiting, unspecified: Secondary | ICD-10-CM

## 2022-11-05 DIAGNOSIS — E269 Hyperaldosteronism, unspecified: Secondary | ICD-10-CM | POA: Diagnosis not present

## 2022-11-05 DIAGNOSIS — D649 Anemia, unspecified: Secondary | ICD-10-CM | POA: Diagnosis not present

## 2022-11-05 DIAGNOSIS — Z23 Encounter for immunization: Secondary | ICD-10-CM | POA: Diagnosis not present

## 2022-11-05 MED ORDER — ZOSTER VAC RECOMB ADJUVANTED 50 MCG/0.5ML IM SUSR
0.5000 mL | Freq: Once | INTRAMUSCULAR | 0 refills | Status: AC
Start: 2022-11-05 — End: 2022-11-05

## 2022-11-05 MED ORDER — SPIRONOLACTONE 25 MG PO TABS: 25.0000 mg | ORAL_TABLET | Freq: Every day | ORAL | 0 refills | Status: DC

## 2022-11-05 NOTE — Progress Notes (Signed)
    SUBJECTIVE:   CHIEF COMPLAINT / HPI:   Hypertension  Hyperaldosteronism Nausea & Vomiting with Presyncope  History of Hypokalemia Presents with her daughter today who is vocally concerned about the patient.  It seems that the patient has had several episodes over the past few months where she becomes nauseous, sometimes vomits and feels presyncopal.  This most recently happened on Sunday when she was outside of a restaurant and became nauseous, vomited into trash can and had a presyncopal episode. Did not actually lose consciousness. This was self resolving and she rehydrated.  But her daughter is concerned that this might be related to her prior findings of hypokalemia versus anemia.  Of note, we have been working her up for hyperaldosteronism.  We are currently awaiting CT of the Adrenals to differentiate aldosterone-producing adenoma vs bilateral adrenal hyperplasia secondary to idiopathic aldosteronism.  Anemia History of profound iron deficiency.  However, on recent lab work has been found to be persistently anemic despite iron supplementation which seems to have adequately repleted her iron stores based on iron studies.  However, she does tell me today that she has recently stopped taking her iron.  I have been trying to get her in with GI for a an EGD.  She had a normal colonoscopy 4 years ago after a positive FIT test.  Denies any epigastric pain at present.   OBJECTIVE:   BP (!) 140/90   Pulse 84   Ht 5\' 3"  (1.6 m)   Wt 130 lb 12.8 oz (59.3 kg)   SpO2 100%   BMI 23.17 kg/m   Gen: Well-appearing and NAD HENT: MMM Neck: Supple and without LAD Cardio: Regular rate and rhythm, without murmur Pulm: Normal WOB on RA, speaking in full sentences, lungs clear to auscultation in all fields Abd: Non-tender and non-distended, without mass or organomegaly  EKG obtained with normal sinus rhythm, no concerning ST elevations/depressions or TWI.   ASSESSMENT/PLAN:   Nausea &  vomiting Unusual that this is recurrent.  Wonder if this could be related to her hyperaldosteronism and subsequent electrolyte/metabolic abnormalities versus related to an upper GI issue that is also contributing to her chronic anemia?  Feels well today.  Did obtain an EKG given reports of presyncope with these episodes but suspect that any presyncope might be more vagally mediated in the setting of retching and vomiting. -Repeat BMP today -EKG unrevealing   Hyperaldosteronism (HCC) - Called [facility] to expedite her CT Scan - Will empirically start spironolactone 25mg  daily. Hopeful this will help with her BP as well      J Dorothyann Gibbs, MD [facility]

## 2022-11-05 NOTE — Patient Instructions (Addendum)
Please call [facility] to get your endoscopy scheduled.  [phone]   I'm rechecking your K and Hgb today.   I need you to take your iron every day.   Your CT scan will be at [facility] at [address] The scan itself will be Aug 29th 10am. You will need to go by sometime before then to pick up the oral contrast for the study.  They are open 7:30a-5p Monday-Friday. You do not need an appointment to pick up contrast.   I will call you on Friday with your lab results.   Eliezer Mccoy, MD

## 2022-11-06 DIAGNOSIS — R112 Nausea with vomiting, unspecified: Secondary | ICD-10-CM | POA: Insufficient documentation

## 2022-11-06 NOTE — Assessment & Plan Note (Signed)
Unusual that this is recurrent.  Wonder if this could be related to her hyperaldosteronism and subsequent electrolyte/metabolic abnormalities versus related to an upper GI issue that is also contributing to her chronic anemia?  Feels well today.  Did obtain an EKG given reports of presyncope with these episodes but suspect that any presyncope might be more vagally mediated in the setting of retching and vomiting. -Repeat BMP today -EKG unrevealing

## 2022-11-06 NOTE — Assessment & Plan Note (Signed)
-   Called [facility] to expedite her CT Scan - Will empirically start spironolactone 25mg  daily. Hopeful this will help with her BP as well

## 2022-11-07 NOTE — Assessment & Plan Note (Signed)
I remain concerned for potential GI source. Also note that she has not been taking her iron as prescribed. - Repeat CBC today - Gave her the number to the GI office to call for an appointment

## 2022-11-27 ENCOUNTER — Ambulatory Visit
Admission: RE | Admit: 2022-11-27 | Discharge: 2022-11-27 | Disposition: A | Discharge status: Home / Self Care | Payer: Medicaid Other | Source: Ambulatory Visit | Attending: Family Medicine | Admitting: Family Medicine

## 2022-11-27 ENCOUNTER — Other Ambulatory Visit: Payer: Medicaid Other

## 2022-11-27 DIAGNOSIS — E269 Hyperaldosteronism, unspecified: Secondary | ICD-10-CM

## 2022-11-27 MED ORDER — IOPAMIDOL (ISOVUE-300) INJECTION 61%
500.0000 mL | Freq: Once | INTRAVENOUS | Status: AC | PRN
  Administered 2022-11-27: 100 mL via INTRAVENOUS

## 2022-12-24 ENCOUNTER — Other Ambulatory Visit: Payer: Self-pay | Admitting: Student

## 2022-12-24 DIAGNOSIS — Z1231 Encounter for screening mammogram for malignant neoplasm of breast: Secondary | ICD-10-CM

## 2023-01-07 ENCOUNTER — Ambulatory Visit: Payer: Medicaid Other | Admitting: Pharmacist

## 2023-02-12 ENCOUNTER — Ambulatory Visit
Admission: RE | Admit: 2023-02-12 | Discharge: 2023-02-12 | Disposition: A | Discharge status: Home / Self Care | Payer: Medicaid Other | Source: Ambulatory Visit

## 2023-02-12 DIAGNOSIS — Z1231 Encounter for screening mammogram for malignant neoplasm of breast: Secondary | ICD-10-CM

## 2023-02-28 DIAGNOSIS — H5203 Hypermetropia, bilateral: Secondary | ICD-10-CM | POA: Diagnosis not present

## 2023-03-20 ENCOUNTER — Other Ambulatory Visit: Payer: Self-pay | Admitting: Student

## 2023-03-20 DIAGNOSIS — E611 Iron deficiency: Secondary | ICD-10-CM

## 2023-03-26 ENCOUNTER — Ambulatory Visit: Payer: Medicaid Other | Admitting: Student

## 2023-03-26 NOTE — Progress Notes (Deleted)
    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 Awaiting CT of the adrenals.   Anemia Have also been trying to get her in with GI for an upper endoscopy. Still has not happened.   PERTINENT  PMH / PSH: ***  OBJECTIVE:   There were no vitals taken for this visit.  ***  ASSESSMENT/PLAN:   No problem-specific Assessment & Plan notes found for this encounter.     Eliezer Mccoy, MD [facility]

## 2023-04-15 ENCOUNTER — Other Ambulatory Visit: Payer: Self-pay | Admitting: Student

## 2023-04-15 DIAGNOSIS — I10 Essential (primary) hypertension: Secondary | ICD-10-CM

## 2023-04-16 DIAGNOSIS — H2513 Age-related nuclear cataract, bilateral: Secondary | ICD-10-CM | POA: Diagnosis not present

## 2023-05-05 ENCOUNTER — Ambulatory Visit (INDEPENDENT_AMBULATORY_CARE_PROVIDER_SITE_OTHER): Payer: Medicaid Other | Admitting: Student

## 2023-05-05 ENCOUNTER — Other Ambulatory Visit: Payer: Self-pay | Admitting: Student

## 2023-05-05 ENCOUNTER — Encounter: Payer: Self-pay | Admitting: Student

## 2023-05-05 VITALS — BP 156/88 | HR 88 | Ht 63.0 in | Wt 133.4 lb

## 2023-05-05 DIAGNOSIS — E269 Hyperaldosteronism, unspecified: Secondary | ICD-10-CM | POA: Diagnosis not present

## 2023-05-05 DIAGNOSIS — I10 Essential (primary) hypertension: Secondary | ICD-10-CM | POA: Diagnosis not present

## 2023-05-05 DIAGNOSIS — E611 Iron deficiency: Secondary | ICD-10-CM | POA: Diagnosis not present

## 2023-05-05 DIAGNOSIS — D649 Anemia, unspecified: Secondary | ICD-10-CM | POA: Diagnosis not present

## 2023-05-05 DIAGNOSIS — Z131 Encounter for screening for diabetes mellitus: Secondary | ICD-10-CM | POA: Diagnosis not present

## 2023-05-05 LAB — POCT GLYCOSYLATED HEMOGLOBIN (HGB A1C): Hemoglobin A1C: 5.4 % (ref 4.0–5.6)

## 2023-05-05 MED ORDER — FERROUS SULFATE 325 (65 FE) MG PO TBEC: 1.0000 | DELAYED_RELEASE_TABLET | Freq: Every day | ORAL | 0 refills | Status: DC

## 2023-05-05 NOTE — Patient Instructions (Addendum)
 Please call [facility] to get your endoscopy scheduled.  [phone]      I really think it would be wise to start Spironolactone , but obviously I can't make you do something you're not interested in.   I'm repeating your labs today and will call you with my recommendations.   Come back to see me in 2 weeks after taking your BP meds.   JINNY Con Gasman, MD

## 2023-05-05 NOTE — Progress Notes (Signed)
    SUBJECTIVE:   CHIEF COMPLAINT / HPI:   Anemia Here to have her anemia labs repeated.  She is hopeful that she can stop taking her iron supplementation as it bothers her gut.  We have been trying to get her in with GI for upper and lower endoscopies. Unfortunately has not seen them yet.  Had a normal colonoscopy in 08/2018 but has not had an upper endoscopy.  Hypertension Is supposed to be taking spironolactone  and amlodipine  due to concerns for hyperaldosteronism contributing to her chronic hypertension.  However, she tells me that she has not been taking the spironolactone  due to personal preference.  She feels that because she feels well on amlodipine  monotherapy that she does not need a second agent.  She tells me that despite my efforts to get her to take another medication, she is not interested at this time.  She attributes her elevated blood pressures today to life stressors including a sister who is on hospice with a brain tumor and her husband's recent diagnosis of prostate cancer.  She is amenable to following up with me in 2 weeks for a repeat blood pressure check and revisiting medication changes at that visit.   OBJECTIVE:   BP (!) 156/88   Pulse 88   Ht 5' 3 (1.6 m)   Wt 133 lb 6.4 oz (60.5 kg)   SpO2 100%   BMI 23.63 kg/m   Gen: Well appearing and NAD  Cardio: RRR without murmur Pulm: normal WOB on RA, lungs are clear Ext; Without edema or deformity   ASSESSMENT/PLAN:   Assessment & Plan Normocytic anemia Thought to be secondary to her hyperaldosteronism plus or minus ongoing GI losses.  She certainly still needs an upper endoscopy and likely a repeat colonoscopy.  She has not yet followed up with GI but I urged her to do this again today.  I am concerned that if she remains anemic, we will not be able to discontinue her iron just yet as she would like. -Repeat CBC, iron, TIBC, ferritin -Have given her the number to [facility] to call and get set up for her  endoscopic evaluations -Continue iron supplementation for now Essential hypertension Uncontrolled.  She has not been taking her spironolactone  and is resistant to doing so.  We discussed the asymptomatic nature of hypertension and that this is 1 aspect that makes it a particularly dangerous condition.  She is unwilling to add another medication at this time but is willing to return in 2 weeks for repeat BP check. -Continue amlodipine  10 mg daily for now -BMP today -Return in 2 weeks, will discuss addition of other medications again at that time   J Con Gasman, MD [facility]

## 2023-05-06 LAB — CBC
Hematocrit: 30.2 % — ABNORMAL LOW (ref 34.0–46.6)
Hemoglobin: 9.8 g/dL — ABNORMAL LOW (ref 11.1–15.9)
MCH: 28.4 pg (ref 26.6–33.0)
MCHC: 32.5 g/dL (ref 31.5–35.7)
MCV: 88 fL (ref 79–97)
Platelets: 306 10*3/uL (ref 150–450)
RBC: 3.45 x10E6/uL — ABNORMAL LOW (ref 3.77–5.28)
RDW: 15.1 % (ref 11.7–15.4)
WBC: 3 10*3/uL — ABNORMAL LOW (ref 3.4–10.8)

## 2023-05-06 LAB — BASIC METABOLIC PANEL
BUN/Creatinine Ratio: 16 (ref 12–28)
BUN: 14 mg/dL (ref 8–27)
CO2: 25 mmol/L (ref 20–29)
Calcium: 8.9 mg/dL (ref 8.7–10.3)
Chloride: 106 mmol/L (ref 96–106)
Creatinine, Ser: 0.87 mg/dL (ref 0.57–1.00)
Glucose: 84 mg/dL (ref 70–99)
Potassium: 3.4 mmol/L — ABNORMAL LOW (ref 3.5–5.2)
Sodium: 141 mmol/L (ref 134–144)
eGFR: 75 mL/min/{1.73_m2} (ref >59)

## 2023-05-06 LAB — IRON,TIBC AND FERRITIN PANEL
Ferritin: 81 ng/mL (ref 15–150)
Iron Saturation: 21 % (ref 15–55)
Iron: 44 ug/dL (ref 27–139)
Total Iron Binding Capacity: 214 ug/dL — ABNORMAL LOW (ref 250–450)
UIBC: 170 ug/dL (ref 118–369)

## 2023-05-07 ENCOUNTER — Other Ambulatory Visit: Payer: Self-pay | Admitting: Student

## 2023-05-07 ENCOUNTER — Encounter: Payer: Self-pay | Admitting: Student

## 2023-05-07 DIAGNOSIS — I10 Essential (primary) hypertension: Secondary | ICD-10-CM

## 2023-05-07 MED ORDER — AMLODIPINE BESYLATE 10 MG PO TABS: 10.0000 mg | ORAL_TABLET | Freq: Every day | ORAL | 3 refills | Status: AC

## 2023-05-07 MED ORDER — HYDROCHLOROTHIAZIDE 25 MG PO TABS: 25.0000 mg | ORAL_TABLET | Freq: Every day | ORAL | 3 refills | Status: DC

## 2023-05-07 NOTE — Assessment & Plan Note (Signed)
 Thought to be secondary to her hyperaldosteronism plus or minus ongoing GI losses.  She certainly still needs an upper endoscopy and likely a repeat colonoscopy.  She has not yet followed up with GI but I urged her to do this again today.  I am concerned that if she remains anemic, we will not be able to discontinue her iron just yet as she would like. -Repeat CBC, iron, TIBC, ferritin -Have given her the number to [facility] to call and get set up for her endoscopic evaluations -Continue iron supplementation for now

## 2023-05-07 NOTE — Telephone Encounter (Signed)
 Patient called requesting a refill for her blood pressure medication, she is out.

## 2023-05-20 ENCOUNTER — Ambulatory Visit: Payer: Medicaid Other | Admitting: Student

## 2023-05-25 ENCOUNTER — Ambulatory Visit: Payer: Medicaid Other | Admitting: Student

## 2023-06-12 DIAGNOSIS — F17219 Nicotine dependence, cigarettes, with unspecified nicotine-induced disorders: Secondary | ICD-10-CM | POA: Diagnosis not present

## 2023-06-12 DIAGNOSIS — I10 Essential (primary) hypertension: Secondary | ICD-10-CM | POA: Diagnosis not present

## 2023-06-12 DIAGNOSIS — H2512 Age-related nuclear cataract, left eye: Secondary | ICD-10-CM | POA: Diagnosis not present

## 2023-06-16 ENCOUNTER — Encounter: Payer: Self-pay | Admitting: Physician Assistant

## 2023-06-30 ENCOUNTER — Other Ambulatory Visit: Payer: Self-pay | Admitting: *Deleted

## 2023-06-30 ENCOUNTER — Telehealth: Payer: Self-pay | Admitting: *Deleted

## 2023-06-30 DIAGNOSIS — Z122 Encounter for screening for malignant neoplasm of respiratory organs: Secondary | ICD-10-CM

## 2023-06-30 DIAGNOSIS — Z87891 Personal history of nicotine dependence: Secondary | ICD-10-CM

## 2023-06-30 DIAGNOSIS — F17210 Nicotine dependence, cigarettes, uncomplicated: Secondary | ICD-10-CM

## 2023-06-30 NOTE — Telephone Encounter (Signed)
 Lung Cancer Screening Narrative/Criteria Questionnaire (Cigarette Smokers Only- No Cigars/Pipes/vapes)   Amerah Puleo Palomarez   SDMV:07/27/23 8:45Orpha Bur                                           [birth year]              LDCT: 07/28/23 9:40 -GI    63 y.o.   Phone: [phone]  Lung Screening Narrative (confirm age 40-77 yrs Medicare / 50-80 yrs Private pay insurance)   Insurance information:[employer]   Referring Provider:[facility]   This screening involves an initial phone call with a team member from our program. It is called a shared decision making visit. The initial meeting is required by insurance and Medicare to make sure you understand the program. This appointment takes about 15-20 minutes to complete. The CT scan will completed at a separate date/time. This scan takes about 5-10 minutes to complete and you may eat and drink before and after the scan.  Criteria questions for Lung Cancer Screening:   Are you a current or former smoker? Current Age began smoking: 60   If you are a former smoker, what year did you quit smoking?(within 15 yrs)   To calculate your smoking history, I need an accurate estimate of how many packs of cigarettes you smoked per day and for how many years. (Not just the number of PPD you are now smoking)   Years smoking 43 x Packs per day 3/4 = Pack years 32   (at least 20 pack yrs)   (Make sure they understand that we need to know how much they have smoked in the past, not just the number of PPD they are smoking now)  Do you have a personal history of cancer?  No    Do you have a family history of cancer? Yes  (cancer type and and relative) sister( brain) Mother (bone) Uncle (lung)   Are you coughing up blood?  No  Have you had unexplained weight loss of 15 lbs or more in the last 6 months? No  It looks like you meet all criteria.     Additional information: N/A

## 2023-07-27 ENCOUNTER — Encounter: Payer: Self-pay | Admitting: Adult Health

## 2023-07-27 ENCOUNTER — Ambulatory Visit: Admitting: Adult Health

## 2023-07-27 DIAGNOSIS — F17210 Nicotine dependence, cigarettes, uncomplicated: Secondary | ICD-10-CM | POA: Diagnosis not present

## 2023-07-27 NOTE — Patient Instructions (Signed)

## 2023-07-27 NOTE — Progress Notes (Signed)
  Virtual Visit via Telephone Note  I connected with Joie M Borin , 07/27/23 8:44 AM by a telemedicine application and verified that I am speaking with the correct person using two identifiers.  Location: Patient: home Provider: home   I discussed the limitations of evaluation and management by telemedicine and the availability of in person appointments. The patient expressed understanding and agreed to proceed.   Shared Decision Making Visit Lung Cancer Screening Program [phone])   Eligibility: 63 y.o. Pack Years Smoking History Calculation = 35 pack years  (# packs/per year x # years smoked) Recent History of coughing up blood  no Unexplained weight loss? no ( >Than 15 pounds within the last 6 months ) Prior History Lung / other cancer no (Diagnosis within the last 5 years already requiring surveillance chest CT Scans). Smoking Status Current Smoker  Visit Components: Discussion included one or more decision making aids. YES Discussion included risk/benefits of screening. YES Discussion included potential follow up diagnostic testing for abnormal scans. YES Discussion included meaning and risk of over diagnosis. YES Discussion included meaning and risk of False Positives. YES Discussion included meaning of total radiation exposure. YES  Counseling Included: Importance of adherence to annual lung cancer LDCT screening. YES Impact of comorbidities on ability to participate in the program. YES Ability and willingness to under diagnostic treatment. YES  Smoking Cessation Counseling: Current Smokers:  Discussed importance of smoking cessation. yes Information about tobacco cessation classes and interventions provided to patient. yes Patient provided with "ticket" for LDCT Scan. yes Symptomatic Patient. NO Diagnosis Code: Tobacco Use Z72.0 Asymptomatic Patient yes  Counseling (Intermediate counseling: > three minutes counseling) N8295 (CT Chest Lung Cancer Screening Low  Dose W/O CM) [id number]  Z12.2-Screening of respiratory organs Z87.891-Personal history of nicotine dependence   Cullen Dose 07/27/23

## 2023-07-28 ENCOUNTER — Other Ambulatory Visit

## 2023-07-28 ENCOUNTER — Ambulatory Visit (HOSPITAL_COMMUNITY): Attending: Student

## 2023-07-28 ENCOUNTER — Encounter (HOSPITAL_COMMUNITY): Payer: Self-pay

## 2023-07-31 ENCOUNTER — Encounter (HOSPITAL_COMMUNITY): Payer: Self-pay | Admitting: Emergency Medicine

## 2023-07-31 ENCOUNTER — Other Ambulatory Visit: Payer: Self-pay

## 2023-07-31 ENCOUNTER — Emergency Department (HOSPITAL_COMMUNITY)
Admission: EM | Admit: 2023-07-31 | Discharge: 2023-08-01 | Disposition: A | Discharge status: Home / Self Care | Attending: Emergency Medicine | Admitting: Emergency Medicine

## 2023-07-31 DIAGNOSIS — R197 Diarrhea, unspecified: Secondary | ICD-10-CM | POA: Diagnosis not present

## 2023-07-31 DIAGNOSIS — E876 Hypokalemia: Secondary | ICD-10-CM | POA: Diagnosis not present

## 2023-07-31 DIAGNOSIS — R112 Nausea with vomiting, unspecified: Secondary | ICD-10-CM | POA: Diagnosis not present

## 2023-07-31 DIAGNOSIS — Z79899 Other long term (current) drug therapy: Secondary | ICD-10-CM | POA: Insufficient documentation

## 2023-07-31 DIAGNOSIS — I10 Essential (primary) hypertension: Secondary | ICD-10-CM | POA: Diagnosis not present

## 2023-07-31 DIAGNOSIS — R42 Dizziness and giddiness: Secondary | ICD-10-CM | POA: Diagnosis not present

## 2023-07-31 DIAGNOSIS — R55 Syncope and collapse: Secondary | ICD-10-CM | POA: Diagnosis not present

## 2023-07-31 MED ORDER — ONDANSETRON HCL 4 MG/2ML IJ SOLN
4.0000 mg | Freq: Once | INTRAMUSCULAR | Status: AC
  Administered 2023-08-01: 4 mg via INTRAVENOUS
  Filled 2023-07-31: qty 2

## 2023-07-31 MED ORDER — LACTATED RINGERS IV BOLUS
1000.0000 mL | Freq: Once | INTRAVENOUS | Status: AC
  Administered 2023-08-01: 1000 mL via INTRAVENOUS

## 2023-07-31 NOTE — ED Triage Notes (Signed)
 BIBA Per EMS: pt c/o sudden onset N/V/D Dizziness after going to bathroom. Pt c/o chills as well.  Hx low K  125/64 88 HR  100% 142 CBG

## 2023-08-01 LAB — CBC WITH DIFFERENTIAL/PLATELET
Abs Immature Granulocytes: 0.02 10*3/uL (ref 0.00–0.07)
Basophils Absolute: 0 10*3/uL (ref 0.0–0.1)
Basophils Relative: 0 %
Eosinophils Absolute: 0 10*3/uL (ref 0.0–0.5)
Eosinophils Relative: 0 %
HCT: 32.1 % — ABNORMAL LOW (ref 36.0–46.0)
Hemoglobin: 10.3 g/dL — ABNORMAL LOW (ref 12.0–15.0)
Immature Granulocytes: 0 %
Lymphocytes Relative: 16 %
Lymphs Abs: 0.9 10*3/uL (ref 0.7–4.0)
MCH: 28.9 pg (ref 26.0–34.0)
MCHC: 32.1 g/dL (ref 30.0–36.0)
MCV: 89.9 fL (ref 80.0–100.0)
Monocytes Absolute: 0.2 10*3/uL (ref 0.1–1.0)
Monocytes Relative: 4 %
Neutro Abs: 4.2 10*3/uL (ref 1.7–7.7)
Neutrophils Relative %: 80 %
Platelets: 309 10*3/uL (ref 150–400)
RBC: 3.57 MIL/uL — ABNORMAL LOW (ref 3.87–5.11)
RDW: 16.2 % — ABNORMAL HIGH (ref 11.5–15.5)
WBC: 5.3 10*3/uL (ref 4.0–10.5)
nRBC: 0 % (ref 0.0–0.2)

## 2023-08-01 LAB — COMPREHENSIVE METABOLIC PANEL WITH GFR
ALT: 14 U/L (ref 0–44)
AST: 22 U/L (ref 15–41)
Albumin: 3.3 g/dL — ABNORMAL LOW (ref 3.5–5.0)
Alkaline Phosphatase: 61 U/L (ref 38–126)
Anion gap: 8 (ref 5–15)
BUN: 14 mg/dL (ref 8–23)
CO2: 23 mmol/L (ref 22–32)
Calcium: 8.6 mg/dL — ABNORMAL LOW (ref 8.9–10.3)
Chloride: 104 mmol/L (ref 98–111)
Creatinine, Ser: 1.02 mg/dL — ABNORMAL HIGH (ref 0.44–1.00)
GFR, Estimated: >60 mL/min (ref >60)
Glucose, Bld: 118 mg/dL — ABNORMAL HIGH (ref 70–99)
Potassium: 3 mmol/L — ABNORMAL LOW (ref 3.5–5.1)
Sodium: 135 mmol/L (ref 135–145)
Total Bilirubin: 0.5 mg/dL (ref 0.0–1.2)
Total Protein: 9.3 g/dL — ABNORMAL HIGH (ref 6.5–8.1)

## 2023-08-01 LAB — TROPONIN I (HIGH SENSITIVITY)
Troponin I (High Sensitivity): 15 ng/L (ref <18)
Troponin I (High Sensitivity): 16 ng/L (ref <18)

## 2023-08-01 LAB — LIPASE, BLOOD: Lipase: 44 U/L (ref 11–51)

## 2023-08-01 LAB — MAGNESIUM: Magnesium: 1.9 mg/dL (ref 1.7–2.4)

## 2023-08-01 MED ORDER — POTASSIUM CHLORIDE CRYS ER 20 MEQ PO TBCR
40.0000 meq | EXTENDED_RELEASE_TABLET | Freq: Once | ORAL | Status: AC
  Administered 2023-08-01: 40 meq via ORAL
  Filled 2023-08-01: qty 2

## 2023-08-01 MED ORDER — ONDANSETRON 4 MG PO TBDP: 4.0000 mg | ORAL_TABLET | Freq: Three times a day (TID) | ORAL | 0 refills | Status: DC | PRN

## 2023-08-01 NOTE — ED Provider Notes (Signed)
 [place] EMERGENCY DEPARTMENT AT [facility] Provider Note   CSN: [id number] Arrival date & time: 07/31/23  2345     History  No chief complaint on file.   Nicole Barrera is a 63 y.o. female.  Patient with history of hypertension and anemia presents by EMS with sudden onset nausea, vomiting, diarrhea and dizziness.  States she was at [employer] and began to feel ill about 10 PM.  First had nausea with several episodes of vomiting and diarrhea.  Was not able to make it to the bathroom in time and had several loose stools in her clothing.  Both vomiting and diarrhea are nonbloody.  Was dizzy and lightheaded on the toilet and EMS was called.  She describes lightheadedness but no room spinning dizziness.  No abdominal pain.  No chest pain.  No shortness of breath.  No pain with urination or blood in the urine.  No black or bloody stools.  No blood in her emesis.  No travel or sick contacts.  No recent antibiotic use.  No history of stomach issues.  Reports has had colonoscopy in the past that has been unrevealing.  History of anemia no longer on iron. Chills but no documented fever.   The history is provided by the patient.       Home Medications Prior to Admission medications   Medication Sig Start Date End Date Taking? Authorizing Provider  hydrochlorothiazide  (HYDRODIURIL ) 25 MG tablet Take 1 tablet (25 mg total) by mouth daily. 05/07/23   Limmie Ren, MD  amLODipine  (NORVASC ) 10 MG tablet Take 1 tablet (10 mg total) by mouth daily. 05/07/23   Limmie Ren, MD  ferrous sulfate  325 (65 FE) MG EC tablet Take 1 tablet (325 mg total) by mouth daily with breakfast. 05/05/23   Limmie Ren, MD      Allergies    Lisinopril     Review of Systems   Review of Systems  Constitutional:  Positive for chills. Negative for activity change, appetite change and fever.  HENT:  Negative for congestion and rhinorrhea.   Respiratory:  Negative for cough, chest tightness and  shortness of breath.   Cardiovascular:  Negative for chest pain.  Gastrointestinal:  Positive for diarrhea, nausea and vomiting. Negative for abdominal pain.  Genitourinary:  Negative for dysuria and hematuria.  Musculoskeletal:  Negative for arthralgias and myalgias.  Skin:  Negative for rash.  Neurological:  Positive for dizziness, weakness and light-headedness. Negative for numbness.   all other systems are negative except as noted in the HPI and PMH.    Physical Exam Updated Vital Signs BP 137/77   Pulse 80   Temp 98.5 F (36.9 C) (Oral)   Resp 17   SpO2 100%  Physical Exam Vitals and nursing note reviewed.  Constitutional:      General: She is not in acute distress.    Appearance: She is well-developed.  HENT:     Head: Normocephalic and atraumatic.     Mouth/Throat:     Pharynx: No oropharyngeal exudate.  Eyes:     Conjunctiva/sclera: Conjunctivae normal.     Pupils: Pupils are equal, round, and reactive to light.  Neck:     Comments: No meningismus. Cardiovascular:     Rate and Rhythm: Normal rate and regular rhythm.     Heart sounds: Normal heart sounds. No murmur heard. Pulmonary:     Effort: Pulmonary effort is normal. No respiratory distress.     Breath sounds: Normal  breath sounds.  Abdominal:     Palpations: Abdomen is soft.     Tenderness: There is no abdominal tenderness. There is no guarding or rebound.  Musculoskeletal:        General: No tenderness. Normal range of motion.     Cervical back: Normal range of motion and neck supple.  Skin:    General: Skin is warm.  Neurological:     Mental Status: She is alert and oriented to person, place, and time.     Cranial Nerves: No cranial nerve deficit.     Motor: No abnormal muscle tone.     Coordination: Coordination normal.     Comments: No ataxia on finger to nose bilaterally. No pronator drift. 5/5 strength throughout. CN 2-12 intact.Equal grip strength. Sensation intact.   Psychiatric:         Behavior: Behavior normal.     ED Results / Procedures / Treatments   Labs (all labs ordered are listed, but only abnormal results are displayed) Labs Reviewed  CBC WITH DIFFERENTIAL/PLATELET - Abnormal; Notable for the following components:      Result Value   RBC 3.57 (*)    Hemoglobin 10.3 (*)    HCT 32.1 (*)    RDW 16.2 (*)    All other components within normal limits  COMPREHENSIVE METABOLIC PANEL WITH GFR - Abnormal; Notable for the following components:   Potassium 3.0 (*)    Glucose, Bld 118 (*)    Creatinine, Ser 1.02 (*)    Calcium 8.6 (*)    Total Protein 9.3 (*)    Albumin 3.3 (*)    All other components within normal limits  LIPASE, BLOOD  MAGNESIUM   URINALYSIS, ROUTINE W REFLEX MICROSCOPIC  TROPONIN I (HIGH SENSITIVITY)  TROPONIN I (HIGH SENSITIVITY)    EKG EKG Interpretation Date/Time:  Saturday Aug 01 2023 00:16:20 EDT Ventricular Rate:  80 PR Interval:  191 QRS Duration:  91 QT Interval:  373 QTC Calculation: 431 R Axis:   69  Text Interpretation: Sinus rhythm No significant change was found Confirmed by Earma Gloss [phone]) on 08/01/2023 12:22:25 AM  Radiology No results found.  Procedures Procedures    Medications Ordered in ED Medications  lactated ringers bolus 1,000 mL (has no administration in time range)  ondansetron  (ZOFRAN ) injection 4 mg (has no administration in time range)    ED Course/ Medical Decision Making/ A&P                                 Medical Decision Making Amount and/or Complexity of Data Reviewed Independent Historian: EMS Labs: ordered. Decision-making details documented in ED Course. Radiology: ordered and independent interpretation performed. Decision-making details documented in ED Course. ECG/medicine tests: ordered and independent interpretation performed. Decision-making details documented in ED Course.  Risk Prescription drug management.   Sudden onset nausea, vomiting, diarrhea, dizziness,  chills about 10 PM.  Stable vitals on arrival.  Abdomen soft without peritoneal signs.  Will hydrate and check labs. No chest pain or shortness of breath.  No abdominal pain.  Not feeling dizzy anymore.  Nonfocal Neurological exam.  Favor likely viral gastroenteritis versus foodborne illness.  Low suspicion for acute surgical pathology.  EKG sinus rhythm.  No acute ST changes.  Orthostatics are negative.  Blood pressure 139 systolic with lying in 119 with standing.  Labs with stable anemia 10.3. Labs reassuring.  Stable LFTs, lipase and creatinine.  Potassium slightly  low at 3.0 which is replaced.  Patient feels improved.  She is given IV fluids.  She is tolerating p.o.  Able to ambulate without difficulty.  Abdomen is soft and nontender.  Low suspicion for surgical pathology such as cholecystitis, appendicitis, bowel obstruction.  Favor likely gastroenteritis. No vomiting or diarrhea throughout ED course. Patient tolerating p.o. and ambulatory.  Troponin negative x 2.  Denies abdominal pain, chest pain, shortness of breath, dizziness or lightheadedness.  Vitals remained stable.  Suspect likely viral gastroenteritis causing her illness.  Discussed clear liquid diet, advance slowly as tolerated.  Return to the ED with new or worsening symptoms.       Final Clinical Impression(s) / ED Diagnoses Final diagnoses:  Nausea vomiting and diarrhea  Near syncope    Rx / DC Orders ED Discharge Orders     None         Rio Kidane, Mara Seminole, MD 08/01/23 [phone]

## 2023-08-01 NOTE — Discharge Instructions (Addendum)
 Your testing is reassuring.  Start with a clear liquid diet and advance only as tolerated. Return to the ED with chest pain, abdominal pain, vomiting, not able to eat or drink or other concerns.

## 2023-08-07 ENCOUNTER — Ambulatory Visit: Admitting: Physician Assistant

## 2023-08-14 ENCOUNTER — Ambulatory Visit (HOSPITAL_COMMUNITY)

## 2023-08-19 ENCOUNTER — Ambulatory Visit (HOSPITAL_COMMUNITY): Attending: Acute Care

## 2023-09-08 ENCOUNTER — Encounter: Payer: Self-pay | Admitting: *Deleted

## 2023-10-23 ENCOUNTER — Ambulatory Visit (INDEPENDENT_AMBULATORY_CARE_PROVIDER_SITE_OTHER): Admitting: Family Medicine

## 2023-10-23 ENCOUNTER — Encounter: Payer: Self-pay | Admitting: Family Medicine

## 2023-10-23 VITALS — BP 123/81 | HR 81 | Ht 63.0 in | Wt 128.8 lb

## 2023-10-23 DIAGNOSIS — I10 Essential (primary) hypertension: Secondary | ICD-10-CM

## 2023-10-23 DIAGNOSIS — E611 Iron deficiency: Secondary | ICD-10-CM

## 2023-10-23 MED ORDER — FERROUS SULFATE 325 (65 FE) MG PO TBEC: 1.0000 | DELAYED_RELEASE_TABLET | Freq: Every day | ORAL | 0 refills | Status: AC

## 2023-10-23 NOTE — Progress Notes (Unsigned)
    SUBJECTIVE:   CHIEF COMPLAINT / HPI:   HTN  -Takes hydrochlorothiazide  and amlodipine   -checks BP at home, sometimes high -Was previously told to be on amlodipine  only, though she felt both was better for her -No recent dizz/lighthead/LOC  Anemia  -Ran out of iron supplement  PERTINENT  PMH / PSH: HTN,   OBJECTIVE:   BP 123/81   Pulse 81   Ht 5' 3 (1.6 m)   Wt 128 lb 12.8 oz (58.4 kg)   SpO2 92%   BMI 22.82 kg/m   General: Well-appearing. Resting comfortably in room. CV: Normal S1/S2. No extra heart sounds. Warm and well-perfused. Pulm: Breathing comfortably on room air. CTAB. No increased WOB. Skin:  Warm, dry. Psych: Pleasant and appropriate.    ASSESSMENT/PLAN:   Assessment & Plan Primary hypertension BP well controlled today.  - Cont current regimen  - BMP Iron deficiency - CBC  - Ferritin  - Refilled iron supplement      Damien Cassis, MD [facility]

## 2023-10-23 NOTE — Patient Instructions (Addendum)
 Thank you for visiting clinic today and allowing us  to participate in your care!  Your blood pressure was good today! Please continue taking your medications as you have been. We are checking your labs today and I will let you know the results.   Please schedule an appointment in 1 month for follow up.   Reach out any time with any questions or concerns you may have - we are here for you!  Damien Cassis, MD [facility] [phone]

## 2023-10-24 LAB — CBC
Hematocrit: 31.8 % — ABNORMAL LOW (ref 34.0–46.6)
Hemoglobin: 10.3 g/dL — ABNORMAL LOW (ref 11.1–15.9)
MCH: 28.9 pg (ref 26.6–33.0)
MCHC: 32.4 g/dL (ref 31.5–35.7)
MCV: 89 fL (ref 79–97)
Platelets: 292 x10E3/uL (ref 150–450)
RBC: 3.57 x10E6/uL — ABNORMAL LOW (ref 3.77–5.28)
RDW: 14 % (ref 11.7–15.4)
WBC: 2.9 x10E3/uL — ABNORMAL LOW (ref 3.4–10.8)

## 2023-10-24 LAB — BASIC METABOLIC PANEL WITH GFR
BUN/Creatinine Ratio: 12 (ref 12–28)
BUN: 10 mg/dL (ref 8–27)
CO2: 23 mmol/L (ref 20–29)
Calcium: 8.8 mg/dL (ref 8.7–10.3)
Chloride: 103 mmol/L (ref 96–106)
Creatinine, Ser: 0.82 mg/dL (ref 0.57–1.00)
Glucose: 84 mg/dL (ref 70–99)
Potassium: 3.4 mmol/L — ABNORMAL LOW (ref 3.5–5.2)
Sodium: 137 mmol/L (ref 134–144)
eGFR: 80 mL/min/1.73 (ref >59)

## 2023-10-24 LAB — FERRITIN: Ferritin: 138 ng/mL (ref 15–150)

## 2023-10-24 NOTE — Assessment & Plan Note (Signed)
 BP well controlled today.  - Cont current regimen  - BMP - May consider adjusting hydrochlorothiazide  if low K

## 2023-10-26 ENCOUNTER — Ambulatory Visit: Payer: Self-pay | Admitting: Family Medicine

## 2023-10-26 DIAGNOSIS — E611 Iron deficiency: Secondary | ICD-10-CM

## 2023-10-26 DIAGNOSIS — D649 Anemia, unspecified: Secondary | ICD-10-CM

## 2023-11-19 ENCOUNTER — Ambulatory Visit

## 2023-12-08 ENCOUNTER — Ambulatory Visit

## 2023-12-11 ENCOUNTER — Encounter: Payer: Self-pay | Admitting: Gastroenterology

## 2024-01-25 ENCOUNTER — Other Ambulatory Visit: Payer: Self-pay | Admitting: Family Medicine

## 2024-01-25 DIAGNOSIS — Z1231 Encounter for screening mammogram for malignant neoplasm of breast: Secondary | ICD-10-CM

## 2024-02-03 ENCOUNTER — Ambulatory Visit: Admitting: Gastroenterology

## 2024-02-03 NOTE — Progress Notes (Deleted)
 Nicole Barrera [id number] 05/08/1960   Chief Complaint:  Referring Provider: Jerrie Gathers, DO Primary GI MD: Dr. Shila  HPI: Nicole Barrera is a 63 y.o. female with past medical history of *** who presents today for a complaint of iron deficiency and chronic normocytic anemia.    Patient is on ferrous sulfate  325 mg daily.  Most recent CBC with hemoglobin 10.3.  Referred by PCP.  Visit 10/23/2023 at which time it was noted that patient had a normal colonoscopy in 2020 and had previously been advised to have an EGD though not completed.   Discussed the use of AI scribe software for clinical note transcription with the patient, who gave verbal consent to proceed.  History of Present Illness       Previous GI Procedures/Imaging   Colonoscopy 09/01/2018 - Non- bleeding internal hemorrhoids.  - The examination was otherwise normal.  - No specimens collected. - Recall 10 years   Past Medical History:  Diagnosis Date   Allergy    ANEMIA-IRON DEFICIENCY 02/27/2009   Qualifier: Diagnosis of  By: Lelon RIGGERS, Scott     HTN (hypertension)    PEPTIC ULCER DISEASE 11/19/2006   Qualifier: Diagnosis of  By: Loretha MD, Victoria     Tobacco abuse     No past surgical history on file.  Current Outpatient Medications  Medication Sig Dispense Refill   hydrochlorothiazide  (HYDRODIURIL ) 25 MG tablet Take 1 tablet (25 mg total) by mouth daily. 90 tablet 3   amLODipine  (NORVASC ) 10 MG tablet Take 1 tablet (10 mg total) by mouth daily. 90 tablet 3   ferrous sulfate  325 (65 FE) MG EC tablet Take 1 tablet (325 mg total) by mouth daily with breakfast. 30 tablet 0   ondansetron  (ZOFRAN -ODT) 4 MG disintegrating tablet Take 1 tablet (4 mg total) by mouth every 8 (eight) hours as needed for nausea or vomiting. 20 tablet 0   No current facility-administered medications for this visit.    Allergies as of 02/03/2024 - Review Complete 10/23/2023  Allergen Reaction Noted   Lisinopril    06/23/2012    Family History  Problem Relation Age of Onset   Breast cancer Mother    Breast cancer Sister    Colon cancer Neg Hx    Esophageal cancer Neg Hx    Rectal cancer Neg Hx    Stomach cancer Neg Hx     Social History   Tobacco Use   Smoking status: Every Day    Current packs/day: 0.50    Types: Cigarettes   Smokeless tobacco: Never  Vaping Use   Vaping status: Never Used  Substance Use Topics   Alcohol use: No   Drug use: No     Review of Systems:    Constitutional: No weight loss, fever, chills, weakness or fatigue Eyes: No change in vision Ears, Nose, Throat:  No change in hearing or congestion Skin: No rash or itching Cardiovascular: No chest pain, chest pressure or palpitations   Respiratory: No SOB or cough Gastrointestinal: See HPI and otherwise negative Genitourinary: No dysuria or change in urinary frequency Neurological: No headache, dizziness or syncope Musculoskeletal: No new muscle or joint pain Hematologic: No bleeding or bruising    Physical Exam:  Vital signs: There were no vitals taken for this visit.  Constitutional: NAD, Well developed, Well nourished, alert and cooperative Head:  Normocephalic and atraumatic.  Eyes: No scleral icterus. Conjunctiva pink. Mouth: No oral lesions. Respiratory: Respirations even and unlabored. Lungs clear to auscultation  bilaterally.  No wheezes, crackles, or rhonchi.  Cardiovascular:  Regular rate and rhythm. No murmurs. No peripheral edema. Gastrointestinal:  Soft, nondistended, nontender. No rebound or guarding. Normal bowel sounds. No appreciable masses or hepatomegaly. Rectal:  Not performed.  Neurologic:  Alert and oriented x4;  grossly normal neurologically.  Skin:   Dry and intact without significant lesions or rashes. Psychiatric: Oriented to person, place and time. Demonstrates good judgement and reason without abnormal affect or behaviors.   RELEVANT LABS AND IMAGING: CBC    Component  Value Date/Time   WBC 2.9 (L) 10/23/2023 1452   WBC 5.3 08/01/2023 0009   RBC 3.57 (L) 10/23/2023 1452   RBC 3.57 (L) 08/01/2023 0009   HGB 10.3 (L) 10/23/2023 1452   HCT 31.8 (L) 10/23/2023 1452   PLT 292 10/23/2023 1452   MCV 89 10/23/2023 1452   MCH 28.9 10/23/2023 1452   MCH 28.9 08/01/2023 0009   MCHC 32.4 10/23/2023 1452   MCHC 32.1 08/01/2023 0009   RDW 14.0 10/23/2023 1452   LYMPHSABS 0.9 08/01/2023 0009   LYMPHSABS 0.9 04/16/2022 1354   MONOABS 0.2 08/01/2023 0009   EOSABS 0.0 08/01/2023 0009   EOSABS 0.1 04/16/2022 1354   BASOSABS 0.0 08/01/2023 0009   BASOSABS 0.0 04/16/2022 1354    CMP     Component Value Date/Time   NA 137 10/23/2023 1452   K 3.4 (L) 10/23/2023 1452   CL 103 10/23/2023 1452   CO2 23 10/23/2023 1452   GLUCOSE 84 10/23/2023 1452   GLUCOSE 118 (H) 08/01/2023 0009   BUN 10 10/23/2023 1452   CREATININE 0.82 10/23/2023 1452   CREATININE 0.97 11/10/2013 1122   CALCIUM 8.8 10/23/2023 1452   PROT 9.3 (H) 08/01/2023 0009   PROT 8.4 03/05/2022 1138   ALBUMIN 3.3 (L) 08/01/2023 0009   ALBUMIN 3.6 (L) 03/05/2022 1138   AST 22 08/01/2023 0009   ALT 14 08/01/2023 0009   ALKPHOS 61 08/01/2023 0009   BILITOT 0.5 08/01/2023 0009   BILITOT 0.2 03/05/2022 1138   GFRNONAA >60 08/01/2023 0009   GFRAA 82 05/04/2019 1651   Echocardiogram 07/23/2021 1. Left ventricular ejection fraction, by estimation, is 60 to 65% . The left ventricle has normal function. The left ventricle has no regional wall motion abnormalities. There is mild left ventricular hypertrophy. Left ventricular diastolic parameters are consistent with Grade II diastolic dysfunction ( pseudonormalization) . The average left ventricular global longitudinal strain is - 21. 0 % . The global longitudinal strain is normal.  2. Right ventricular systolic function is normal. The right ventricular size is normal. There is normal pulmonary artery systolic pressure.  3. Left atrial size was mildly dilated.   4. The mitral valve is grossly normal. No evidence of mitral valve regurgitation.  5. The aortic valve is grossly normal. Aortic valve regurgitation is not visualized.  6. The inferior vena cava is normal in size with greater than 50% respiratory variability, suggesting right atrial pressure of 3 mmHg.  Assessment/Plan:    Assessment and Plan Assessment & Plan   EGD      Camie Furbish, PA-C [facility] 02/03/2024, 8:17 AM  Patient Care Team: Bhagat, Virali, DO as PCP - General (Family Medicine)

## 2024-02-15 ENCOUNTER — Ambulatory Visit

## 2024-03-10 ENCOUNTER — Ambulatory Visit
Admission: RE | Admit: 2024-03-10 | Discharge: 2024-03-10 | Disposition: A | Discharge status: Home / Self Care | Source: Ambulatory Visit | Attending: Family Medicine | Admitting: Family Medicine

## 2024-03-10 DIAGNOSIS — Z1231 Encounter for screening mammogram for malignant neoplasm of breast: Secondary | ICD-10-CM

## 2024-03-17 ENCOUNTER — Ambulatory Visit

## 2024-03-17 VITALS — BP 115/69 | HR 76 | Ht 63.0 in | Wt 129.4 lb

## 2024-03-17 DIAGNOSIS — I10 Essential (primary) hypertension: Secondary | ICD-10-CM | POA: Diagnosis not present

## 2024-03-17 DIAGNOSIS — D649 Anemia, unspecified: Secondary | ICD-10-CM | POA: Diagnosis not present

## 2024-03-17 NOTE — Progress Notes (Signed)
° ° °  SUBJECTIVE:   CHIEF COMPLAINT / HPI:   Nicole Barrera is a 63 y.o.female who presents today for anemia f/u  States she would like her iron checked. Has been feeling cold. Was previously rx'd iron supplements which she stopped taking in September due to abdominal pain 2/2 constipation. She was taking this daily. Denies blood in the stool, hemoptysis, AUB, or easy bruising.  PERTINENT  PMH / PSH: HTN  OBJECTIVE:   BP 115/69   Pulse 76   Ht 5' 3 (1.6 m)   Wt 129 lb 6 oz (58.7 kg)   SpO2 100%   BMI 22.92 kg/m    General: Alert, well-appearing female in NAD.  Cardiovascular: RRR, no m/r/g appreciated. Radial pulse +2 bilaterally Pulmonary: Normal WOB. CTAB with no w/c/r present. Neurologic: Normal gait, moves all four extremities appropriately Psych: Appropriate mood and affect   ASSESSMENT/PLAN:   Assessment & Plan Normocytic anemia Chronic anemia, last Hgb 10.3 and ferritin 138 in June 2025. Normal colonoscopy in 2020. Previously referred for EGD though unable to complete. Suspect this is a mixed anemia and will work up as below - Labs: CBC, haptoglobin, iron, TIBC, ferritin, LDH, reticulocytes, TSH, vitamin B12 - Referral sent to GI for EGD - Refilled iron supplement. Recommend taking every other day due to constipation Primary hypertension BP well controlled today. Continue current regimen - BMP today   Darren Jernigan, DO [facility]

## 2024-03-17 NOTE — Assessment & Plan Note (Signed)
 BP well controlled today. Continue current regimen - BMP today

## 2024-03-17 NOTE — Assessment & Plan Note (Signed)
 Chronic anemia, last Hgb 10.3 and ferritin 138 in June 2025. Normal colonoscopy in 2020. Previously referred for EGD though unable to complete. Suspect this is a mixed anemia and will work up as below - Labs: CBC, haptoglobin, iron, TIBC, ferritin, LDH, reticulocytes, TSH, vitamin B12 - Referral sent to GI for EGD - Refilled iron supplement. Recommend taking every other day due to constipation

## 2024-03-17 NOTE — Patient Instructions (Signed)
 Thank you for visiting the clinic today, it was good to see you!  Our plans for today: - Take iron every other day - We are getting several labs today. I will send you a MyChart message or a letter if results are normal. Otherwise, I will give you a call. - Referral placed for EGD by GI. The office will call you to set up an appointment. Please give us  a call if you don't hear back in the next 2 weeks.  Please return for your annual visit.  Please arrive 15 minutes PRIOR to your next scheduled appointment time! If you do not, this affects OTHER patients' care.  For any questions, please call the office at [phone] or send me a message in MyChart.  It was a pleasure to take care of you today. Have a great day!  Malva Diesing, DO [facility] Resident, PGY-1  -----------------------------------------------------------------------------  Do you need your medications delivered to your home?   Well send your prescription to the [facility] for delivery.          Address: [address] [place], [place], [place] [address]          Phone: [phone]  Please call the [facility] to speak with a pharmacist and set up your home medication delivery. If you have any questions, feel free to contact us  -- were happy to help!  Other [facility] that offer affordable prices on both prescriptions and over-the-counter items, as well as convenient services like vaccinations, are  [facility], at [facility]         Address: [address], [place], [place] [address]         Phone: [phone]  [facility], located in the [facility]        Address: [address], [place], [place] [address]        Phone: [phone]  [facility], at [facility]       Address: [address], [place], [place] [address]       Phone: [phone]  [facility], at [facility]       Address: [address], [place], [place] [address]       Phone: [phone]  -----------------------------------------------------------------------------  Food Resources SNAP/ Food benefits - [place] [phone] [place]: [address] [place]: [address] [facility]) Nutrition program [place]: [phone] [place]: [phone] [facility] Helping [facility] - Free Produce & Food (Every Thursday 9AM to 11:00AM) [employer] Seventh Day [employer] [phone] [address], [place], [place]: [address]; [phone] [place]: [address]; [phone]  The [facility] [employer] app, formally [employer], connects those living in [place], [place] & surrounding areas with healthy food options & access to emergency resources. This app aims to alleviate some of the barriers to food access by making the many ways people in [place], [place] & surrounding areas can access food resources publicly available.    This app is the product of the [employer], whose mission is to coordinate and improve the effectiveness of entities in [place] [employer] focused on alleviating hunger by creating and executing citywide and neighborhood-focused initiatives to develop more just and sustainable food systems.

## 2024-03-18 LAB — IRON,TIBC AND FERRITIN PANEL
Ferritin: 165 ng/mL — ABNORMAL HIGH (ref 15–150)
Iron Saturation: 23 % (ref 15–55)
Iron: 49 ug/dL (ref 27–139)
Total Iron Binding Capacity: 213 ug/dL — ABNORMAL LOW (ref 250–450)
UIBC: 164 ug/dL (ref 118–369)

## 2024-03-18 LAB — BASIC METABOLIC PANEL WITH GFR
BUN/Creatinine Ratio: 14 (ref 12–28)
BUN: 13 mg/dL (ref 8–27)
CO2: 26 mmol/L (ref 20–29)
Calcium: 9.3 mg/dL (ref 8.7–10.3)
Chloride: 103 mmol/L (ref 96–106)
Creatinine, Ser: 0.92 mg/dL (ref 0.57–1.00)
Glucose: 100 mg/dL — ABNORMAL HIGH (ref 70–99)
Potassium: 3.1 mmol/L — ABNORMAL LOW (ref 3.5–5.2)
Sodium: 139 mmol/L (ref 134–144)
eGFR: 70 mL/min/1.73

## 2024-03-18 LAB — LACTATE DEHYDROGENASE: LDH: 120 IU/L (ref 119–226)

## 2024-03-18 LAB — CBC
Hematocrit: 28 % — ABNORMAL LOW (ref 34.0–46.6)
Hemoglobin: 9.3 g/dL — ABNORMAL LOW (ref 11.1–15.9)
MCH: 29.4 pg (ref 26.6–33.0)
MCHC: 33.2 g/dL (ref 31.5–35.7)
MCV: 89 fL (ref 79–97)
Platelets: 329 x10E3/uL (ref 150–450)
RBC: 3.16 x10E6/uL — ABNORMAL LOW (ref 3.77–5.28)
RDW: 14.6 % (ref 11.7–15.4)
WBC: 4.4 x10E3/uL (ref 3.4–10.8)

## 2024-03-18 LAB — HAPTOGLOBIN: Haptoglobin: 118 mg/dL (ref 37–355)

## 2024-03-18 LAB — TSH RFX ON ABNORMAL TO FREE T4: TSH: 1.5 u[IU]/mL (ref 0.450–4.500)

## 2024-03-18 LAB — RETICULOCYTES: Retic Ct Pct: 0.9 % (ref 0.6–2.6)

## 2024-03-18 LAB — VITAMIN B12: Vitamin B-12: 233 pg/mL (ref 232–1245)

## 2024-03-29 ENCOUNTER — Ambulatory Visit: Payer: Self-pay

## 2024-03-29 MED ORDER — POTASSIUM CHLORIDE CRYS ER 10 MEQ PO TBCR: 10.0000 meq | EXTENDED_RELEASE_TABLET | Freq: Every day | ORAL | 0 refills | Status: DC

## 2024-04-14 ENCOUNTER — Ambulatory Visit: Payer: Self-pay
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
## 2024-05-03 ENCOUNTER — Encounter: Payer: Self-pay | Admitting: Family Medicine

## 2024-05-03 ENCOUNTER — Ambulatory Visit: Admitting: Family Medicine

## 2024-05-03 VITALS — BP 120/76 | HR 83 | Ht 64.0 in | Wt 126.2 lb

## 2024-05-03 DIAGNOSIS — D649 Anemia, unspecified: Secondary | ICD-10-CM

## 2024-05-03 DIAGNOSIS — R55 Syncope and collapse: Secondary | ICD-10-CM

## 2024-05-03 DIAGNOSIS — I10 Essential (primary) hypertension: Secondary | ICD-10-CM | POA: Diagnosis not present

## 2024-05-03 NOTE — Patient Instructions (Addendum)
 It was great to see you today! Thank you for choosing [facility] for your primary care. Cherlyn M Sobh was seen for syncopal episode.  Please bring ALL of your medications with you to every visit.   Today we addressed: Orthostatic hypotension - your blood pressure goes low from laying to standing/sitting. We will change your blood pressure regimen to make your hydrochlorothiazide  12.5 mg daily (you can cut the pills you have in half) and continue taking your amlodipine  as prescribed.   We are checking some labs today. I will send you a MyChart message with your results, per your preference. If you do not hear about your labs in the next 2 weeks, please call the office.   You should return to our clinic Return if symptoms worsen or fail to improve. Please arrive 15 minutes before your appointment to ensure smooth check in process.  We appreciate your efforts in making this happen.  Thank you for allowing me to participate in your care, Kathrine Melena, DO 05/03/2024, 2:17 PM PGY-2, [facility]

## 2024-05-04 LAB — CBC
Hematocrit: 29.9 % — ABNORMAL LOW (ref 34.0–46.6)
Hemoglobin: 9.9 g/dL — ABNORMAL LOW (ref 11.1–15.9)
MCH: 29.3 pg (ref 26.6–33.0)
MCHC: 33.1 g/dL (ref 31.5–35.7)
MCV: 89 fL (ref 79–97)
Platelets: 316 10*3/uL (ref 150–450)
RBC: 3.38 x10E6/uL — ABNORMAL LOW (ref 3.77–5.28)
RDW: 14.3 % (ref 11.7–15.4)
WBC: 3.9 10*3/uL (ref 3.4–10.8)

## 2024-05-04 LAB — BASIC METABOLIC PANEL WITH GFR
BUN/Creatinine Ratio: 13 (ref 12–28)
BUN: 12 mg/dL (ref 8–27)
CO2: 24 mmol/L (ref 20–29)
Calcium: 8.8 mg/dL (ref 8.7–10.3)
Chloride: 101 mmol/L (ref 96–106)
Creatinine, Ser: 0.89 mg/dL (ref 0.57–1.00)
Glucose: 90 mg/dL (ref 70–99)
Potassium: 3 mmol/L — ABNORMAL LOW (ref 3.5–5.2)
Sodium: 138 mmol/L (ref 134–144)
eGFR: 73 mL/min/{1.73_m2}

## 2024-05-05 ENCOUNTER — Other Ambulatory Visit: Payer: Self-pay

## 2024-05-05 ENCOUNTER — Ambulatory Visit: Payer: Self-pay | Admitting: Family Medicine

## 2024-05-05 MED ORDER — POTASSIUM CHLORIDE 20 MEQ PO PACK: 20.0000 meq | PACK | Freq: Every day | ORAL | 0 refills | Status: DC

## 2024-05-06 NOTE — Telephone Encounter (Signed)
 Patient calls nurse line in regards to potassium prescription.   She reports the medication was called in yesterday, however in the packet powder form.   She reports nausea with the packet.  She is requesting the tablet form.   [employer].   Will forward to provider who saw patient.   PCP apt scheduled for 2/16.

## 2024-05-16 ENCOUNTER — Ambulatory Visit: Payer: Self-pay
